# Patient Record
Sex: Female | Born: 1985 | Race: Black or African American | Hispanic: No | Marital: Single | Smoking: Never smoker
Health system: Southern US, Community
[De-identification: ages and names within clinical notes are randomized; demographics above are authoritative.]

## PROBLEM LIST (undated history)

## (undated) DIAGNOSIS — N736 Female pelvic peritoneal adhesions (postinfective): Secondary | ICD-10-CM

## (undated) DIAGNOSIS — D259 Leiomyoma of uterus, unspecified: Secondary | ICD-10-CM

## (undated) DIAGNOSIS — Z8759 Personal history of other complications of pregnancy, childbirth and the puerperium: Secondary | ICD-10-CM

## (undated) DIAGNOSIS — N920 Excessive and frequent menstruation with regular cycle: Secondary | ICD-10-CM

## (undated) DIAGNOSIS — D649 Anemia, unspecified: Secondary | ICD-10-CM

## (undated) DIAGNOSIS — Z9851 Tubal ligation status: Secondary | ICD-10-CM

## (undated) DIAGNOSIS — J45909 Unspecified asthma, uncomplicated: Secondary | ICD-10-CM

## (undated) HISTORY — DX: Female pelvic peritoneal adhesions (postinfective): N73.6

## (undated) HISTORY — DX: Excessive and frequent menstruation with regular cycle: N92.0

## (undated) HISTORY — DX: Tubal ligation status: Z98.51

## (undated) HISTORY — DX: Anemia, unspecified: D64.9

## (undated) HISTORY — DX: Personal history of other complications of pregnancy, childbirth and the puerperium: Z87.59

## (undated) HISTORY — DX: Leiomyoma of uterus, unspecified: D25.9

## (undated) HISTORY — PX: TUBAL LIGATION: SHX77

---

## 2004-11-08 ENCOUNTER — Emergency Department: Payer: Self-pay | Admitting: Emergency Medicine

## 2004-12-05 ENCOUNTER — Emergency Department: Payer: Self-pay | Admitting: Emergency Medicine

## 2005-05-02 ENCOUNTER — Observation Stay: Payer: Self-pay

## 2005-06-04 ENCOUNTER — Inpatient Hospital Stay: Payer: Self-pay | Admitting: Obstetrics & Gynecology

## 2008-03-31 ENCOUNTER — Emergency Department: Payer: Self-pay | Admitting: Emergency Medicine

## 2009-07-20 ENCOUNTER — Emergency Department: Payer: Self-pay | Admitting: Emergency Medicine

## 2011-08-07 ENCOUNTER — Emergency Department: Payer: Self-pay | Admitting: *Deleted

## 2011-09-25 ENCOUNTER — Observation Stay: Payer: Self-pay

## 2011-10-19 LAB — HM PAP SMEAR: HM PAP: NORMAL

## 2011-10-28 ENCOUNTER — Emergency Department: Payer: Self-pay | Admitting: Emergency Medicine

## 2011-11-08 ENCOUNTER — Observation Stay: Payer: Self-pay | Admitting: Obstetrics and Gynecology

## 2011-11-08 LAB — URINALYSIS, COMPLETE
Bacteria: NONE SEEN
Bilirubin,UR: NEGATIVE
Blood: NEGATIVE
Glucose,UR: NEGATIVE mg/dL (ref 0–75)
Leukocyte Esterase: NEGATIVE
Nitrite: NEGATIVE
Ph: 6 (ref 4.5–8.0)
RBC,UR: <1 /HPF (ref 0–5)
Specific Gravity: 1.012 (ref 1.003–1.030)
Squamous Epithelial: <1
WBC UR: 1 /HPF (ref 0–5)

## 2011-11-10 LAB — URINE CULTURE

## 2011-11-25 ENCOUNTER — Observation Stay: Payer: Self-pay | Admitting: Obstetrics and Gynecology

## 2011-12-06 ENCOUNTER — Inpatient Hospital Stay: Payer: Self-pay | Admitting: Obstetrics and Gynecology

## 2011-12-06 LAB — CBC WITH DIFFERENTIAL/PLATELET
Basophil %: 0.2 %
Eosinophil #: 0.3 10*3/uL (ref 0.0–0.7)
Eosinophil %: 4.3 %
HCT: 33.9 % — ABNORMAL LOW (ref 35.0–47.0)
Lymphocyte %: 19.7 %
MCH: 30.9 pg (ref 26.0–34.0)
MCHC: 34 g/dL (ref 32.0–36.0)
Monocyte #: 0.4 10*3/uL (ref 0.0–0.7)
Monocyte %: 5.5 %
Neutrophil #: 4.6 10*3/uL (ref 1.4–6.5)
Neutrophil %: 70.3 %
Platelet: 244 10*3/uL (ref 150–440)
RBC: 3.73 10*6/uL — ABNORMAL LOW (ref 3.80–5.20)
WBC: 6.6 10*3/uL (ref 3.6–11.0)

## 2011-12-08 LAB — HEMATOCRIT: HCT: 28.5 % — ABNORMAL LOW (ref 35.0–47.0)

## 2012-01-06 ENCOUNTER — Ambulatory Visit: Payer: Self-pay | Admitting: Obstetrics and Gynecology

## 2012-01-06 LAB — URINALYSIS, COMPLETE
Bacteria: NONE SEEN
Glucose,UR: NEGATIVE mg/dL (ref 0–75)
Leukocyte Esterase: NEGATIVE
Nitrite: NEGATIVE
RBC,UR: NONE SEEN /HPF (ref 0–5)
Specific Gravity: 1.013 (ref 1.003–1.030)
Squamous Epithelial: 4
WBC UR: <1 /HPF (ref 0–5)

## 2012-01-06 LAB — PREGNANCY, URINE: Pregnancy Test, Urine: NEGATIVE m[IU]/mL

## 2012-01-06 LAB — HEMOGLOBIN: HGB: 11.3 g/dL — ABNORMAL LOW (ref 12.0–16.0)

## 2012-01-16 ENCOUNTER — Ambulatory Visit: Payer: Self-pay | Admitting: Obstetrics and Gynecology

## 2012-01-16 LAB — PREGNANCY, URINE: Pregnancy Test, Urine: NEGATIVE m[IU]/mL

## 2013-01-30 ENCOUNTER — Emergency Department: Payer: Self-pay | Admitting: Emergency Medicine

## 2013-01-30 LAB — URINALYSIS, COMPLETE
RBC,UR: 2 /HPF (ref 0–5)
Squamous Epithelial: 7
WBC UR: 2 /HPF (ref 0–5)

## 2013-01-30 LAB — CBC
HCT: 31.1 % — ABNORMAL LOW (ref 35.0–47.0)
MCH: 27.4 pg (ref 26.0–34.0)
Platelet: 324 10*3/uL (ref 150–440)
RBC: 3.85 10*6/uL (ref 3.80–5.20)
RDW: 15.1 % — ABNORMAL HIGH (ref 11.5–14.5)
WBC: 6.2 10*3/uL (ref 3.6–11.0)

## 2013-01-30 LAB — COMPREHENSIVE METABOLIC PANEL
Albumin: 3.3 g/dL — ABNORMAL LOW (ref 3.4–5.0)
Alkaline Phosphatase: 44 U/L — ABNORMAL LOW (ref 50–136)
BUN: 14 mg/dL (ref 7–18)
Co2: 26 mmol/L (ref 21–32)
Creatinine: 0.86 mg/dL (ref 0.60–1.30)
EGFR (African American): >60
EGFR (Non-African Amer.): >60
Glucose: 84 mg/dL (ref 65–99)
SGOT(AST): 17 U/L (ref 15–37)
Sodium: 141 mmol/L (ref 136–145)

## 2014-12-16 HISTORY — PX: BILATERAL SALPINGECTOMY: SHX5743

## 2014-12-19 ENCOUNTER — Observation Stay: Payer: Self-pay | Admitting: Obstetrics and Gynecology

## 2014-12-19 DIAGNOSIS — Z8759 Personal history of other complications of pregnancy, childbirth and the puerperium: Secondary | ICD-10-CM

## 2014-12-19 HISTORY — DX: Personal history of other complications of pregnancy, childbirth and the puerperium: Z87.59

## 2015-01-21 ENCOUNTER — Emergency Department: Admit: 2015-01-21 | Disposition: A | Discharge status: Home / Self Care | Payer: Self-pay | Admitting: Student

## 2015-01-21 LAB — CBC
HCT: 34.9 % — ABNORMAL LOW (ref 35.0–47.0)
HGB: 11 g/dL — AB (ref 12.0–16.0)
MCH: 23.4 pg — ABNORMAL LOW (ref 26.0–34.0)
MCHC: 31.4 g/dL — AB (ref 32.0–36.0)
MCV: 74 fL — ABNORMAL LOW (ref 80–100)
PLATELETS: 281 10*3/uL (ref 150–440)
RBC: 4.7 10*6/uL (ref 3.80–5.20)
RDW: 29.1 % — AB (ref 11.5–14.5)
WBC: 4.1 10*3/uL (ref 3.6–11.0)

## 2015-01-21 LAB — HCG, QUANTITATIVE, PREGNANCY: Beta Hcg, Quant.: 1 m[IU]/mL

## 2015-02-08 NOTE — Op Note (Signed)
PATIENT NAME:  Anita [name], Anita [name] MR#:  [id number] DATE OF BIRTH:  March 24, 1986  DATE OF PROCEDURE:  01/16/2012  PREOPERATIVE DIAGNOSIS: Desires permanent sterilization, declines less permanent means.   POSTOPERATIVE DIAGNOSIS: Desires permanent sterilization, declines less permanent means.  PROCEDURE: Laparoscopic tubal cautery.   SURGEON: Ricky L. Amalia Hailey, MD  ANESTHESIA: General endotracheal.   FINDINGS: Grossly normal obese abdomen, uterus with what appears to be a left fundal fibroid, grossly normal tubes and ovaries.   ESTIMATED BLOOD LOSS: Minimal.   COMPLICATIONS: None.   DRAINS: In and out catheter, approximately 150 mL of clear urine.   PROCEDURE IN DETAIL: The patient is postpartum. She is obese. She desires sterilization. She consented to interval laparoscopic tubal. We discussed with the patient including failure of 2:300 and 60% chance of ectopic with subsequent pregnancies. She expressed her understand and desired to proceed and declined less permanent means. Consent was signed. Medicaid papers were signed in November.   She was taken to the operating room and placed in the supine position where anesthesia was initiated. She was then placed in the dorsal lithotomy position using Allen stirrups, prepped and draped in the usual sterile fashion. The cervix was visualized. Hulka tenaculum was placed. The bladder was drained.   10 mL of 0.5% Sensorcaine was infused infraumbilically and a #94 blade was used to make a vertical 1 cm infraumbilical incision through which a 10 mm Optiview port was placed under direct visualization with entry into the peritoneal cavity. Pneumoperitoneum was established.  The patient was placed in Trendelenburg position and laparoscopic tubal cautery was carried out in a relatively avascular region in the mesosalpinx bilaterally. Areas were hemostatic, both before and after lowing pressure to 6 mmHg, and pneumoperitoneum was allowed to resolve. The incisions  were closed with deep of zero and a Band-Aid was placed.   All instrument, needle, and sponge counts were correct. The patient tolerated the procedure well. I anticipate a routine postoperative course.  ____________________________ Rockey Situ. Amalia Hailey, MD rle:slb D: 01/16/2012 09:52:55 ET T: 01/16/2012 11:25:19 ET JOB#: [id number]  cc: Ricky L. Amalia Hailey, MD, <Dictator> Selmer Dominion MD ELECTRONICALLY SIGNED 01/17/2012 20:54

## 2015-02-09 LAB — SURGICAL PATHOLOGY

## 2015-02-15 NOTE — Op Note (Signed)
PATIENT NAME:  Preble, LUANNA WEESNER MR#:  [id number] DATE OF BIRTH:  06-29-1986  DATE OF PROCEDURE:  12/19/2014  PREOPERATIVE DIAGNOSES: 1.  Suspect tubal pregnancy.  2.  Right lower quadrant abdominal pain.  3.  Positive pregnancy test. 4.  Status post bilateral tubal ligation.   POSTOPERATIVE DIAGNOSES: 1.  Suspected tubal pregnancy. 2.  Right lower quadrant pain. 3.  Positive pregnancy test.  4.  Status post bilateral tubal ligation.  5.  Left tubal pregnancy.  6.  Pelvic adhesive disease.   OPERATIVE PROCEDURE: Laparoscopic bilateral salpingectomy with excision of ectopic and adhesiolysis.   SURGEON: Malachi Paradise, MD  FIRST ASSISTANT: None.   ANESTHESIA: General endotracheal.   INDICATIONS: The patient is a 29 year old African American female gravida 4, para 2-1-0-2, status post BTL for contraception, with last menstrual period 1 month ago, who presented to the Emergency Room with right lower quadrant pain, positive pregnancy test. emergency room work-up demonstrated an empty uterine cavity on ultrasound along with quantitative hCG of 2400. Fibroid uterus was also noted.   FINDINGS AT SURGERY: There was a 16 week size globular fibroid uterus which extended to the pelvic sidewalls. There was a small hemoperitoneum of approximately 15 mL. There was evidence of left tubal pregnancy with POC extruding from the distal fimbriated ends. This left adnexa was stuck to the left pelvic sidewall and ovary. The left ovary had 2 simple cysts. Right ovary was normal and distal right fallopian tube was normal.   DESCRIPTION OF PROCEDURE: The patient was brought to the operating room where she was placed in the supine position. General endotracheal anesthesia was induced without difficulty. She was placed in dorsal lithotomy position using the bumblebee stirrups. A ChloraPrep and Betadine abdominal, perineal, intravaginal prep and drape was performed in standard fashion. A red Robinson catheter  was used to drain 50 mL of urine from the bladder. Hulka tenaculum was placed onto the cervix to facilitate uterine manipulation. Subumbilical vertical incision 5 mm in length was made. The Optiview laparoscopic trocar system was used to place a 5 mm port directly into the abdominopelvic cavity without evidence of bowel or vascular injury. A second 5 mm port was placed in the left lower quadrant and an 11 mm port was placed in the right lower quadrant under direct visualization. The above-noted findings were photo documented. The left adnexal adhesions and tubal adhesions to the pelvic sidewall were taken down using the Ace Harmonic scalpel. This was followed by excising the left tube with the aid of graspers and the Harmonic scalpel. The mesosalpinx was clamped, coagulated and cut successively to remove the tube and ectopic specimen. A similar procedure was carried out on the right tube with the Harmonic scalpel and graspers. Specimens were removed through the 11 mm port sites. Irrigation of the pelvis was performed and inspection confirmed hemostasis. The irrigant fluid was aspirated. The procedure was then terminated with all instrumentation being removed from the abdominopelvic cavity. Pneumoperitoneum was released. The incisions were closed with 0 Vicryl on the fascia in the 11 mm port incision site. The skin incisions were closed with simple interrupted sutures of 4-0 plain. Band-Aids were placed. The patient was then awakened, extubated and taken to the recovery room in satisfactory condition.   ESTIMATED BLOOD LOSS: 15 mL.   IV FLUIDS: 800 mL.   URINE OUTPUT: 50 mL.   COUNTS: All instruments, needle, and sponge counts were verified as correct.   ADDENDUM NOTE: The patient likely has anemia secondary to menorrhagia  from her uterine fibroids. It is likely that the patient would need an open procedure for hysterectomy if indicated because of the 16 week size and globular nature of the organ which  extends to the pelvic sidewalls. Laparoscopic approach would be challenging due to these anatomic findings.  ____________________________ Alanda Slim Averianna Brugger, MD mad:sb D: 12/19/2014 04:30:45 ET T: 12/19/2014 10:02:23 ET JOB#: [id number]  cc: Hassell Done A. Rahel Carlton, MD, <Dictator> Alanda Slim Karah Caruthers MD ELECTRONICALLY SIGNED 12/22/2014 15:54

## 2015-02-15 NOTE — H&P (Signed)
PATIENT NAME:  [name], [name] MR#:  [id number] DATE OF BIRTH:  03/20/1986  DATE OF ADMISSION:  12/18/2014  CHIEF COMPLAINT:  1.  Suspected tubal pregnancy.  2.  Right lower quadrant.  3.  Positive pregnancy test.  4.  Status post bilateral tubal ligation.    HISTORY: The patient is a 29 year old African American female gravida 4, para 2-1-0-2, status post BTL for contraception with last menstrual period 1 month ago, who presents to the Emergency Room because of right lower quadrant pain and a positive home pregnancy test. The patient felt not quite right and was concerned about pregnancy. She did not have any vaginal bleeding. Evaluation in the Emergency Room demonstrated an empty uterus on ultrasound and a quantitative hCG of 2400. No significant mass or free peritoneal fluid were seen. The patient is anemic with hemoglobin of 7.6 and hematocrit of 25.4. In light of these findings, the patient was counseled to undergo surgery for removal of ectopic and excision of both fallopian tubes.   PAST MEDICAL HISTORY:  1.  Anemia.  2.  Migraines.  3.  Asthma.  4.  Increased body mass index.  PAST SURGICAL HISTORY: BTL.   PAST OBSTETRIC HISTORY: Para 2-1-0-2, G1, 24-week twins with preterm delivery, both deceased; G2, SVD; G3, SVD; G4, current.   FAMILY HISTORY: Negative for cancer of the colon, ovary, or breast. Her mother is deceased from liver cancer.   SOCIAL HISTORY: The patient does not smoke, does not drink, does not use drugs.   CURRENT MEDICATIONS: None.   DRUG ALLERGIES: MICRONIZED PROGESTERONE.   PHYSICAL EXAMINATION:  VITAL SIGNS: Stable. Heart rate 75 beats per minute.  GENERAL: The patient is a pleasant, alert African American female in no acute distress.  LUNGS: Clear.  HEART: Regular rate and rhythm without murmur.  ABDOMEN: Soft, mild tenderness in right lower quadrant without significant peritoneal signs.  PELVIC: Deferred.  EXTREMITIES: Warm and dry.   IMPRESSION:   1.  Right lower quadrant pain with positive hCG, status post tubal ligation, suspicious for ectopic pregnancy.  2.  Anemia.   PLAN: Laparoscopic excision of ectopic pregnancy and bilateral salpingectomy.   PREOPERATIVE CONSENT: The patient is understanding of the planned procedure and she is aware of and is accepting of all surgical risks which include, but are not limited to bleeding, infection, pelvic organ injury with need for repair, blood clot disorders, anesthesia risks, etc. The patient does understand that she will no longer be able to conceive with removal of both fallopian tubes. All questions have been answered. Informed consent is given. The patient is ready and willing to proceed with surgery as scheduled.     ____________________________ Alanda Slim DeFrancesco, MD mad:bm D: 12/19/2014 02:40:00 ET T: 12/19/2014 03:10:03 ET JOB#: [id number]  cc: Hassell Done A. DeFrancesco, MD, <Dictator> Alanda Slim DEFRANCESCO MD ELECTRONICALLY SIGNED 12/22/2014 15:54

## 2015-02-24 NOTE — H&P (Signed)
L&D Evaluation:  History:   HPI 29 y/o G3 P0201 @ 39wks EDC 12/13/11 observed on LD for the last hour due to regular contractions beginning this am. Care @ [facility] HX preterm delivery of twins at 72 weeks-(both babies died day after birth 57) 55 week subsequent well delivery, obesity. Denies leaking fluid or vaginal bleeding, baby is active. GBS +    Presents with contractions    Patient's Medical History No Chronic Illness    Patient's Surgical History none    Medications Pre [place] Vitamins    Allergies 17 hydroprogesterone    Social History none    Family History Non-Contributory   ROS:   ROS All systems were reviewed.  HEENT, CNS, GI, GU, Respiratory, CV, Renal and Musculoskeletal systems were found to be normal.   Exam:   Vital Signs stable    Urine Protein not completed    General no apparent distress    Mental Status clear    Chest clear    Heart normal sinus rhythm    Abdomen gravid, non-tender    Estimated Fetal Weight Large for gestational age    Fetal Position vtx    Fundal Height term+    Back no CVAT    Edema 1+    Reflexes 2+    Clonus negative    Pelvic no external lesions, (closed @ recent [facility] visit) 3-4cm 50% vtx well applied @ -1 BOWI sm show    Mebranes Intact    FHT 130's avg variability with accels    Ucx irregular, q 3/5 mins 60 sec moderate    Skin dry    Lymph no lymphadenopathy   Impression:   Impression early labor   Plan:   Plan monitor contractions and for cervical change    Comments Admitted explained plan of care, knows what to expect. Plans IV pain meds, may decline epidural. Will begin pitocin augment. Family supportive at bedside. Plans PPBTL medicaid papers signed.   Electronic Signatures: Rosie Fate (CNM)  (Signed 19-Feb-13 13:52)  Authored: L&D Evaluation   Last Updated: 19-Feb-13 13:52 by Rosie Fate (CNM)

## 2015-02-24 NOTE — H&P (Signed)
L&D Evaluation:  History:   HPI 29 y/o BF  G3P0201 [facility] 12/13/11 Presents C/O pelvic pressure and pain    Presents with abdominal pain    Patient's Medical History No Chronic Illness  PTL&D at 24 weeks (both died); 36 week SVD OK    Patient's Surgical History none    Medications Pre [place] Vitamins    Allergies NKDA    Social History none    Family History Non-Contributory   ROS:   ROS All systems were reviewed.  HEENT, CNS, GI, GU, Respiratory, CV, Renal and Musculoskeletal systems were found to be normal.   Exam:   Vital Signs stable    General no apparent distress    Mental Status clear    Heart normal sinus rhythm    Abdomen gravid, non-tender    Estimated Fetal Weight Average for gestational age    Back no CVAT    Edema 1+    Mebranes Intact    FHT normal rate with no decels    Ucx irregular    Other pt noted pain occurs with fetal mvt only UC's resoved with PO hydration   Impression:   Impression dehydration, abd pain   Plan:   Comments Hydrate Comfort measures   Electronic Signatures: Edison Nasuti (MD)  (Signed 22-Jan-13 14:01)  Authored: L&D Evaluation   Last Updated: 22-Jan-13 14:01 by Edison Nasuti (MD)

## 2015-03-26 ENCOUNTER — Ambulatory Visit: Payer: Medicaid Other | Admitting: Obstetrics and Gynecology

## 2015-04-29 ENCOUNTER — Ambulatory Visit: Payer: Medicaid Other | Admitting: Obstetrics and Gynecology

## 2015-05-05 ENCOUNTER — Ambulatory Visit (INDEPENDENT_AMBULATORY_CARE_PROVIDER_SITE_OTHER): Payer: Medicaid Other | Admitting: Obstetrics and Gynecology

## 2015-05-05 ENCOUNTER — Encounter: Payer: Self-pay | Admitting: Obstetrics and Gynecology

## 2015-05-05 VITALS — BP 108/74 | HR 71 | Ht 68.0 in | Wt 276.5 lb

## 2015-05-05 DIAGNOSIS — D259 Leiomyoma of uterus, unspecified: Secondary | ICD-10-CM | POA: Diagnosis not present

## 2015-05-05 DIAGNOSIS — Z8759 Personal history of other complications of pregnancy, childbirth and the puerperium: Secondary | ICD-10-CM | POA: Insufficient documentation

## 2015-05-05 DIAGNOSIS — N736 Female pelvic peritoneal adhesions (postinfective): Secondary | ICD-10-CM | POA: Diagnosis not present

## 2015-05-05 DIAGNOSIS — D649 Anemia, unspecified: Secondary | ICD-10-CM | POA: Insufficient documentation

## 2015-05-05 DIAGNOSIS — N920 Excessive and frequent menstruation with regular cycle: Secondary | ICD-10-CM | POA: Insufficient documentation

## 2015-05-05 MED ORDER — LEVONORGESTREL-ETHINYL ESTRAD 0.1-20 MG-MCG PO TABS: 1.0000 | ORAL_TABLET | Freq: Every day | ORAL | Status: DC

## 2015-05-05 NOTE — Progress Notes (Signed)
Patient ID: Anita [name], female   DOB: 08-03-1986, 29 y.o.   MRN: [id number]  3 month f/u on aub  GYN ENCOUNTER NOTE  Subjective:       Anita [name] is a 29 y.o. [phone] female is here for gynecologic evaluation of the following issues:  1. Menorrhagia    D/c ocp- after 1 month did not help Cycles once a month- 5 days, 1-3 heavy- changing every 2 hours- wearing multiple pads  Gynecologic History Patient's last menstrual period was 04/21/2015 (exact date).  Obstetric History OB History  Gravida Para Term Preterm AB SAB TAB Ectopic Multiple Living  4 3 2 1 1   1 1 2     # Outcome Date GA Lbr Len/2nd Weight Sex Delivery Anes PTL Lv  4 Ectopic 2016          3 Term 2013   5 lb 1.9 oz (2.322 kg) F Vag-Spont   Y  2 Term 2006   5 lb 1.8 oz (2.318 kg) F Vag-Spont   Y  1A Preterm 2005    M Vag-Spont   FD  1B Preterm 2005    M    FD      Past Medical History  Diagnosis Date  . Anemia   . Fibroid uterus   . S/P tubal ligation   . Heavy periods   . Pelvic peritoneal adhesions, female   . S/P ectopic pregnancy 12/19/2014    Left tubal pregnancy, excised.    History reviewed. No pertinent past surgical history.  Current Outpatient Prescriptions on File Prior to Visit  Medication Sig Dispense Refill  . docusate sodium (COLACE) 100 MG capsule Take 100 mg by mouth 2 (two) times daily.    . ferrous sulfate 325 (65 FE) MG tablet Take 325 mg by mouth daily.    Marland Kitchen ibuprofen (ADVIL,MOTRIN) 800 MG tablet Take 800 mg by mouth every 8 (eight) hours as needed.     No current facility-administered medications on file prior to visit.    Allergies  Allergen Reactions  . Progestins Hives    History   Social History  . Marital Status: Single    Spouse Name: N/A  . Number of Children: N/A  . Years of Education: N/A   Occupational History  . Not on file.   Social History Main Topics  . Smoking status: Never Smoker   . Smokeless tobacco: Not on file  . Alcohol Use: No  .  Drug Use: No  . Sexual Activity: Yes    Birth Control/ Protection: None   Other Topics Concern  . Not on file   Social History Narrative    Family History  Problem Relation Age of Onset  . Liver cancer Mother   . Diabetes Paternal Grandmother   . Heart disease Neg Hx     The following portions of the patient's history were reviewed and updated as appropriate: allergies, current medications, past family history, past medical history, past social history, past surgical history and problem list.  Review of Systems Review of Systems - General ROS: negative for - chills, fatigue, fever, hot flashes, malaise or night sweats Hematological and Lymphatic ROS: negative for - bleeding problems or swollen lymph nodes Gastrointestinal ROS: negative for - abdominal pain, blood in stools, change in bowel habits and nausea/vomiting Musculoskeletal ROS: negative for - joint pain, muscle pain or muscular weakness Genito-Urinary ROS: negative for -dysmenorrhea, dyspareunia, dysuria, genital discharge, genital ulcers, hematuria, incontinence, irregular/heavy menses, nocturia or pelvic  pain.POSITIVE-change in menstrual cycle  Objective:   BP 108/74 mmHg  Pulse 71  Ht 5\' 8"  (1.727 m)  Wt 276 lb 8 oz (125.42 kg)  BMI 42.05 kg/m2  LMP 04/21/2015 (Exact Date) CONSTITUTIONAL: Well-developed, well-nourished female in no acute distress.  HENT:  Normocephalic, atraumatic.  SKIN: Skin is warm and dry. No rash noted. Not diaphoretic. No erythema. No pallor. [place]: Alert and oriented to person, place, and time. PSYCHIATRIC: Normal mood and affect. Normal behavior. Normal judgment and thought content. CARDIOVASCULAR:Not Examined RESPIRATORY: Not Examined BREASTS: Not Examined ABDOMEN: Soft, non distended; Non tender.  No Organomegaly. PELVIC:  External Genitalia: Normal  BUS: Normal  Vagina: Normal  Cervix: Normal  Uterus: Normal size, shape,consistency, mobile  Adnexa: Normal  RV: Normal    Bladder: Nontende    Assessment:   1. Menorrhagia with regular cycle  - CBC with Differential/Platelet     Plan:   1.Restart OCP'S 2. Menstrual Calendar Monitoring 3.Return 3 months.

## 2015-05-07 LAB — CBC WITH DIFFERENTIAL/PLATELET
BASOS: 1 %
Basophils Absolute: 0 10*3/uL (ref 0.0–0.2)
EOS (ABSOLUTE): 0.2 10*3/uL (ref 0.0–0.4)
Eos: 4 %
HEMATOCRIT: 29.6 % — AB (ref 34.0–46.6)
HEMOGLOBIN: 9.3 g/dL — AB (ref 11.1–15.9)
IMMATURE GRANULOCYTES: 0 %
Immature Grans (Abs): 0 10*3/uL (ref 0.0–0.1)
LYMPHS ABS: 2 10*3/uL (ref 0.7–3.1)
Lymphs: 35 %
MCH: 23.3 pg — ABNORMAL LOW (ref 26.6–33.0)
MCHC: 31.4 g/dL — ABNORMAL LOW (ref 31.5–35.7)
MCV: 74 fL — ABNORMAL LOW (ref 79–97)
Monocytes Absolute: 0.4 10*3/uL (ref 0.1–0.9)
Monocytes: 7 %
NEUTROS PCT: 53 %
Neutrophils Absolute: 3.2 10*3/uL (ref 1.4–7.0)
PLATELETS: 383 10*3/uL — AB (ref 150–379)
RBC: 3.99 x10E6/uL (ref 3.77–5.28)
RDW: 16.1 % — AB (ref 12.3–15.4)
WBC: 5.9 10*3/uL (ref 3.4–10.8)

## 2015-07-13 ENCOUNTER — Telehealth: Payer: Self-pay | Admitting: Obstetrics and Gynecology

## 2015-07-13 NOTE — Telephone Encounter (Signed)
Pt called and she has fibroids and was given BC, and her periods are really bad with the bleeding, she wanted to know what to do about this, if there is anything she can do.

## 2015-07-13 NOTE — Telephone Encounter (Signed)
Pt states her cycle is still very heavy even on the ocp. Cycle comes q 30d, last about 6d, 4 days are heavy. Changing q 2 hours. Pt is wearing mulitple pads. She is taking pills same time q day. Offered to move appt up but pt declines at this time. Wants to discuss hyst but needs to get her insurance and time off correct first.

## 2015-08-05 ENCOUNTER — Ambulatory Visit: Payer: Medicaid Other | Admitting: Obstetrics and Gynecology

## 2016-01-27 DIAGNOSIS — Z9851 Tubal ligation status: Secondary | ICD-10-CM | POA: Diagnosis not present

## 2016-01-27 DIAGNOSIS — R079 Chest pain, unspecified: Secondary | ICD-10-CM | POA: Diagnosis not present

## 2016-01-27 DIAGNOSIS — N938 Other specified abnormal uterine and vaginal bleeding: Secondary | ICD-10-CM | POA: Diagnosis not present

## 2016-01-27 DIAGNOSIS — D649 Anemia, unspecified: Principal | ICD-10-CM | POA: Insufficient documentation

## 2016-01-27 DIAGNOSIS — Z800 Family history of malignant neoplasm of digestive organs: Secondary | ICD-10-CM | POA: Diagnosis not present

## 2016-01-27 DIAGNOSIS — N920 Excessive and frequent menstruation with regular cycle: Secondary | ICD-10-CM | POA: Insufficient documentation

## 2016-01-27 DIAGNOSIS — N736 Female pelvic peritoneal adhesions (postinfective): Secondary | ICD-10-CM | POA: Diagnosis not present

## 2016-01-27 DIAGNOSIS — R51 Headache: Secondary | ICD-10-CM | POA: Diagnosis not present

## 2016-01-27 DIAGNOSIS — Z9119 Patient's noncompliance with other medical treatment and regimen: Secondary | ICD-10-CM | POA: Diagnosis not present

## 2016-01-27 DIAGNOSIS — R0602 Shortness of breath: Secondary | ICD-10-CM | POA: Diagnosis not present

## 2016-01-27 DIAGNOSIS — Z833 Family history of diabetes mellitus: Secondary | ICD-10-CM | POA: Diagnosis not present

## 2016-01-27 DIAGNOSIS — D259 Leiomyoma of uterus, unspecified: Secondary | ICD-10-CM | POA: Insufficient documentation

## 2016-01-28 ENCOUNTER — Encounter: Payer: Self-pay | Admitting: Emergency Medicine

## 2016-01-28 ENCOUNTER — Observation Stay: Payer: Medicaid Other

## 2016-01-28 ENCOUNTER — Encounter: Payer: Self-pay | Admitting: Obstetrics and Gynecology

## 2016-01-28 ENCOUNTER — Observation Stay
Admission: EM | Admit: 2016-01-28 | Discharge: 2016-01-28 | Disposition: A | Discharge status: Home / Self Care | Payer: Medicaid Other | Attending: Obstetrics and Gynecology | Admitting: Obstetrics and Gynecology

## 2016-01-28 ENCOUNTER — Emergency Department: Payer: Medicaid Other

## 2016-01-28 DIAGNOSIS — D649 Anemia, unspecified: Secondary | ICD-10-CM | POA: Diagnosis not present

## 2016-01-28 DIAGNOSIS — D259 Leiomyoma of uterus, unspecified: Secondary | ICD-10-CM | POA: Diagnosis present

## 2016-01-28 DIAGNOSIS — N920 Excessive and frequent menstruation with regular cycle: Secondary | ICD-10-CM

## 2016-01-28 LAB — URINALYSIS COMPLETE WITH MICROSCOPIC (ARMC ONLY)
Bilirubin Urine: NEGATIVE
Glucose, UA: NEGATIVE mg/dL
Hgb urine dipstick: NEGATIVE
KETONES UR: NEGATIVE mg/dL
Leukocytes, UA: NEGATIVE
Nitrite: NEGATIVE
PH: 6 (ref 5.0–8.0)
Protein, ur: NEGATIVE mg/dL
RBC / HPF: NONE SEEN RBC/hpf (ref 0–5)
Specific Gravity, Urine: 1.011 (ref 1.005–1.030)

## 2016-01-28 LAB — POCT PREGNANCY, URINE: PREG TEST UR: NEGATIVE

## 2016-01-28 LAB — CBC
HEMATOCRIT: 19.4 % — AB (ref 35.0–47.0)
HEMOGLOBIN: 5.6 g/dL — AB (ref 12.0–16.0)
MCH: 15.3 pg — AB (ref 26.0–34.0)
MCHC: 29.1 g/dL — ABNORMAL LOW (ref 32.0–36.0)
MCV: 52.6 fL — ABNORMAL LOW (ref 80.0–100.0)
Platelets: 335 10*3/uL (ref 150–440)
RBC: 3.68 MIL/uL — ABNORMAL LOW (ref 3.80–5.20)
RDW: 20.6 % — AB (ref 11.5–14.5)
WBC: 8.2 10*3/uL (ref 3.6–11.0)

## 2016-01-28 LAB — BASIC METABOLIC PANEL
ANION GAP: 6 (ref 5–15)
BUN: 12 mg/dL (ref 6–20)
CHLORIDE: 106 mmol/L (ref 101–111)
CO2: 22 mmol/L (ref 22–32)
Calcium: 8.5 mg/dL — ABNORMAL LOW (ref 8.9–10.3)
Creatinine, Ser: 0.86 mg/dL (ref 0.44–1.00)
GFR calc Af Amer: >60 mL/min (ref >60)
GFR calc non Af Amer: >60 mL/min (ref >60)
GLUCOSE: 80 mg/dL (ref 65–99)
Potassium: 3.4 mmol/L — ABNORMAL LOW (ref 3.5–5.1)
Sodium: 134 mmol/L — ABNORMAL LOW (ref 135–145)

## 2016-01-28 LAB — TROPONIN I: Troponin I: <0.03 ng/mL (ref <0.031)

## 2016-01-28 LAB — HEMOGLOBIN AND HEMATOCRIT, BLOOD
HCT: 22 % — ABNORMAL LOW (ref 35.0–47.0)
HEMATOCRIT: 26.3 % — AB (ref 35.0–47.0)
HEMOGLOBIN: 6.6 g/dL — AB (ref 12.0–16.0)
Hemoglobin: 8.1 g/dL — ABNORMAL LOW (ref 12.0–16.0)

## 2016-01-28 LAB — PREPARE RBC (CROSSMATCH)

## 2016-01-28 LAB — ABO/RH: ABO/RH(D): A POS

## 2016-01-28 MED ORDER — SODIUM CHLORIDE 0.9 % IV SOLN
Freq: Once | INTRAVENOUS | Status: AC
  Administered 2016-01-28: 04:00:00 via INTRAVENOUS

## 2016-01-28 MED ORDER — TEMAZEPAM 15 MG PO CAPS: 15.0000 mg | ORAL_CAPSULE | Freq: Every evening | ORAL | Status: DC | PRN

## 2016-01-28 MED ORDER — FERROUS SULFATE 325 (65 FE) MG PO TABS: 325.0000 mg | ORAL_TABLET | Freq: Three times a day (TID) | ORAL | Status: DC

## 2016-01-28 MED ORDER — DIPHENHYDRAMINE HCL 25 MG PO CAPS
25.0000 mg | ORAL_CAPSULE | Freq: Once | ORAL | Status: AC
  Administered 2016-01-28: 25 mg via ORAL
  Filled 2016-01-28: qty 1

## 2016-01-28 MED ORDER — ONDANSETRON HCL 4 MG PO TABS: 4.0000 mg | ORAL_TABLET | Freq: Four times a day (QID) | ORAL | Status: DC | PRN

## 2016-01-28 MED ORDER — DOCUSATE SODIUM 100 MG PO CAPS: 100.0000 mg | ORAL_CAPSULE | Freq: Two times a day (BID) | ORAL | Status: DC | PRN

## 2016-01-28 MED ORDER — MAGNESIUM HYDROXIDE 400 MG/5ML PO SUSP: 30.0000 mL | Freq: Every day | ORAL | Status: DC | PRN

## 2016-01-28 MED ORDER — ONDANSETRON HCL 4 MG/2ML IJ SOLN: 4.0000 mg | Freq: Four times a day (QID) | INTRAMUSCULAR | Status: DC | PRN

## 2016-01-28 MED ORDER — DOCUSATE SODIUM 100 MG PO CAPS
100.0000 mg | ORAL_CAPSULE | Freq: Two times a day (BID) | ORAL | Status: DC
  Administered 2016-01-28 (×2): 100 mg via ORAL
  Filled 2016-01-28 (×2): qty 1

## 2016-01-28 MED ORDER — SODIUM CHLORIDE 0.9 % IV SOLN
Freq: Once | INTRAVENOUS | Status: AC
  Administered 2016-01-28: 08:00:00 via INTRAVENOUS

## 2016-01-28 MED ORDER — ACETAMINOPHEN 325 MG PO TABS
650.0000 mg | ORAL_TABLET | Freq: Once | ORAL | Status: AC
  Administered 2016-01-28: 650 mg via ORAL
  Filled 2016-01-28: qty 2

## 2016-01-28 MED ORDER — IBUPROFEN 600 MG PO TABS
600.0000 mg | ORAL_TABLET | Freq: Four times a day (QID) | ORAL | Status: DC | PRN
  Administered 2016-01-28: 600 mg via ORAL
  Filled 2016-01-28 (×2): qty 1

## 2016-01-28 MED ORDER — ALUM & MAG HYDROXIDE-SIMETH 200-200-20 MG/5ML PO SUSP: 30.0000 mL | ORAL | Status: DC | PRN

## 2016-01-28 MED ORDER — MEDROXYPROGESTERONE ACETATE 150 MG/ML IM SUSP
150.0000 mg | Freq: Once | INTRAMUSCULAR | Status: AC
  Administered 2016-01-28: 150 mg via INTRAMUSCULAR
  Filled 2016-01-28: qty 1

## 2016-01-28 MED ORDER — LACTATED RINGERS IV SOLN
INTRAVENOUS | Status: DC
  Administered 2016-01-28: 10:00:00 via INTRAVENOUS

## 2016-01-28 MED ORDER — SODIUM CHLORIDE 0.9 % IV SOLN
10.0000 mL/h | Freq: Once | INTRAVENOUS | Status: AC
  Administered 2016-01-28: 10 mL/h via INTRAVENOUS

## 2016-01-28 NOTE — Progress Notes (Signed)
GYNECOLOGY INPATIENT PROGRESS NOTE  Subjective: Patient reports feeling better after blood transfusion.  Denies SOB, chest pain.     Objective: I have reviewed patient's vital signs, intake and output, medications and labs.  Blood pressure 122/63, pulse 87, temperature 98.7 F (37.1 C), temperature source Oral, resp. rate 18, height 5\' 9"  (1.753 m), weight 230 lb (104.327 kg), last menstrual period 01/09/2016, SpO2 99 %.  General: alert and no distress Resp: clear to auscultation bilaterally Cardio: regular rate and rhythm, S1, S2 normal, no murmur, click, rub or gallop GI: soft, non-tender; bowel sounds normal; no masses,  no organomegaly Extremities: extremities normal, atraumatic, no cyanosis or edema Vaginal Bleeding: none   Labs:  CBC Latest Ref Rng 01/28/2016 01/28/2016 01/28/2016  WBC 3.6 - 11.0 K/uL - - 8.2  Hemoglobin 12.0 - 16.0 g/dL 8.1(L) 6.6(L) 5.6(L)  Hematocrit 35.0 - 47.0 % 26.3(L) 22.0(L) 19.4(L)  Platelets 150 - 440 K/uL - - 335     Assessment/Plan: 1) Symptomatic anemia - now s/p total of 4 units PRBCs (inappropriate rise after initial 2 units). Patient no longer symptomatic.  Recommend continuation of iron therapy outpatient TID.  2) Menorrhagia with fibroid uterus - ordered pelvic ultrasound, but still not completed.  Can perform in office as outpatient.  Currently without episode of bleeding.  Desires to use Depo Provera for management.  Discussion had with patient's noted allergy of progestins (notes that it was the 17-OHP injections given during pregnancy, was compounded with a "sesame oil" which caused her to have with itching and flushing").  Patient notes that she is ok to try the Depo Provera.   Will d/c home today.  Advised patient to f/u in office in 3-4 weeks.Will complete ultrasound at that time. Will provide work notice.   Rubie Maid 01/28/2016, 8:01 PM

## 2016-01-28 NOTE — Progress Notes (Signed)
rn received call from dr cherry. md instructed rn to hold off further transfusions and check h/h after unit completed and call to md results

## 2016-01-28 NOTE — Progress Notes (Signed)
Pt discharged home. Pt stated she understood discharge instructions.

## 2016-01-28 NOTE — Progress Notes (Signed)
Results of h/h called to dr cherry.hemoglobin 8.1. Dr Marcelline Mates to come see pt to eval for discharge. md informed that ultrasound has not been done

## 2016-01-28 NOTE — ED Notes (Signed)
Pt reports she has had malaise, SOB on exertion, chest pain and frequent HA for "months" and today the symptoms worsened. Pt reports she was told she needed to take Iron, was prescribed, but has stopped taking them. On assessment, pt is pale skin toned, SOB when walking.

## 2016-01-28 NOTE — H&P (Signed)
GYNECOLOGY HISTORY AND PHYSICAL  Reason for Consult: Symptomatic anemia Referring Physician: Harvest Dark (ER Physician)   HPI:  Anita [name] is a 30 y.o. [phone] female with last menstrual period of 01/09/2016, who presented to the Emergency Room with complaints of headaches and shortness of breath.  The headaches have been ongoing for several weeks, are intermittent.  The shortness of breath has been ongning for several days, and is noted to be worse with exertion.  She has also been noting occasional chest pain with exertion that began yesterday.  Of note, patient has a h/o fibroid uterus and anemia secondary to heavy periods.  Is supposed to be taking iron supplements but is non-compliant.   While in the Emergency Room, patient's Hgb was noted to be 5.6.    Pertinent Gynecological History: Menarche age:51  Menses: flow is excessive with use of 8-9 pads or tampons on heaviest days.  Cycles last 5-6 days.  Bleeding: dysfunctional uterine bleeding Contraception: tubal ligation Blood transfusions: none Sexually transmitted diseases: no past history Previous GYN Procedures: bilateral salpingectomy s/p ectopic pregnancy  Last pap: normal Date: 10/2011    OB History  Gravida Para Term Preterm AB SAB TAB Ectopic Multiple Living  _0 # Outcome Date GA Lbr Len/2nd Weight Sex Delivery Anes PTL Lv  4 Ectopic 2016          3 Term 2013   5 lb 1.9 oz (2.322 kg) F Vag-Spont   Y  2 Term 2006   5 lb 1.8 oz (2.318 kg) F Vag-Spont   Y  1A Preterm 2005    M Vag-Spont   FD  1B Preterm 2005    M    FD       Past Medical History  Diagnosis Date  . Anemia   . Fibroid uterus   . Heavy periods   . Pelvic peritoneal adhesions, female   . S/P ectopic pregnancy 12/19/2014    Left tubal pregnancy, excised.    Past Surgical History  Procedure Laterality Date  . Tubal ligation    . Bilateral salpingectomy  12/2014    s/p left ectopic pregnancy    Family History  Problem  Relation Age of Onset  . Liver cancer Mother   . Diabetes Paternal Grandmother   . Heart disease Neg Hx     Social History   Social History  . Marital Status: Single    Spouse Name: N/A  . Number of Children: N/A  . Years of Education: N/A   Occupational History  . Not on file.   Social History Main Topics  . Smoking status: Never Smoker   . Smokeless tobacco: Not on file  . Alcohol Use: No  . Drug Use: No  . Sexual Activity: Yes    Birth Control/ Protection: None   Other Topics Concern  . Not on file   Social History Narrative    Allergies:  Allergies  Allergen Reactions  . Progestins Hives    No current facility-administered medications on file prior to encounter.   No current outpatient prescriptions on file prior to encounter.     Review of Systems  Constitutional: Negative for fever, chills and weight loss.  HENT: Negative for congestion, ear pain and sore throat.   Eyes: Negative for blurred vision and pain.  Respiratory: Positive for shortness of breath. Negative for cough and wheezing.   Cardiovascular: Positive for chest pain. Negative for  palpitations and leg swelling.  Gastrointestinal: Negative for nausea, vomiting, abdominal pain, diarrhea, constipation and blood in stool.  Genitourinary: Negative for dysuria, urgency and frequency.  Musculoskeletal: Negative for myalgias, back pain and falls.  Neurological: Positive for headaches. Negative for dizziness, sensory change and loss of consciousness.  Endo/Heme/Allergies: Does not bruise/bleed easily.  Psychiatric/Behavioral: Negative for depression and substance abuse. The patient does not have insomnia.     Blood pressure 129/83, pulse 73, temperature 98.7 F (37.1 C), temperature source Axillary, resp. rate 26, height _0  (1.753 m), weight 230 lb (104.327 kg), last menstrual period 01/09/2016, SpO2 100 %. Physical Exam  Constitutional: She is oriented to person, place, and time. She appears  well-developed and well-nourished. No distress.  HENT:  Head: Normocephalic and atraumatic.  Right Ear: External ear normal.  Left Ear: External ear normal.  Nose: Nose normal.  Mouth/Throat: Oropharynx is clear and moist.  Eyes: Conjunctivae and EOM are normal. Pupils are equal, round, and reactive to light.  Neck: Normal range of motion. Neck supple. No JVD present. No tracheal deviation present. No thyromegaly present.  Cardiovascular: Normal rate, regular rhythm and normal heart sounds.  Exam reveals no gallop and no friction rub.   No murmur heard. Respiratory: Effort normal and breath sounds normal. No respiratory distress.  GI: Soft. Bowel sounds are normal. She exhibits no distension. There is no tenderness.  Genitourinary: Vagina normal. No vaginal discharge found.  Cervix appears normal, no lesions. Uterus enlarged, ~ 16 week size, mobile, nontender.  Adnexae without tenderness bilaterally.   Musculoskeletal: Normal range of motion. She exhibits no edema or tenderness.  Lymphadenopathy:    She has no cervical adenopathy.  Neurological: She is alert and oriented to person, place, and time.  Skin: Skin is warm and dry. No rash noted. No erythema.  Psychiatric: She has a normal mood and affect. Her behavior is normal.    Labs:  Results for orders placed or performed during the hospital encounter of 01/28/16 (from the past 48 hour(s))  Basic metabolic panel     Status: Abnormal   Collection Time: 01/28/16 12:14 AM  Result Value Ref Range   Sodium 134 (L) 135 - 145 mmol/L   Potassium 3.4 (L) 3.5 - 5.1 mmol/L   Chloride 106 101 - 111 mmol/L   CO2 22 22 - 32 mmol/L   Glucose, Bld 80 65 - 99 mg/dL   BUN 12 6 - 20 mg/dL   Creatinine, Ser 0.86 0.44 - 1.00 mg/dL   Calcium 8.5 (L) 8.9 - 10.3 mg/dL   GFR calc non Af Amer >60 >60 mL/min   GFR calc Af Amer >60 >60 mL/min    Comment: (NOTE) The eGFR has been calculated using the CKD EPI equation. This calculation has not been  validated in all clinical situations. eGFR's persistently <60 mL/min signify possible Chronic Kidney Disease.    Anion gap 6 5 - 15  CBC     Status: Abnormal   Collection Time: 01/28/16 12:14 AM  Result Value Ref Range   WBC 8.2 3.6 - 11.0 K/uL   RBC 3.68 (L) 3.80 - 5.20 MIL/uL   Hemoglobin 5.6 (L) 12.0 - 16.0 g/dL   HCT 19.4 (L) 35.0 - 47.0 %   MCV 52.6 (L) 80.0 - 100.0 fL   MCH 15.3 (L) 26.0 - 34.0 pg   MCHC 29.1 (L) 32.0 - 36.0 g/dL   RDW 20.6 (H) 11.5 - 14.5 %   Platelets 335 150 - 440 K/uL  Troponin I     Status: None   Collection Time: 01/28/16 12:14 AM  Result Value Ref Range   Troponin I <0.03 <0.031 ng/mL    Comment:        NO INDICATION OF MYOCARDIAL INJURY.   Urinalysis complete, with microscopic ([facility] only)     Status: Abnormal   Collection Time: 01/28/16 12:15 AM  Result Value Ref Range   Color, Urine STRAW (A) YELLOW   APPearance CLEAR (A) CLEAR   Glucose, UA NEGATIVE NEGATIVE mg/dL   Bilirubin Urine NEGATIVE NEGATIVE   Ketones, ur NEGATIVE NEGATIVE mg/dL   Specific Gravity, Urine 1.011 1.005 - 1.030   Hgb urine dipstick NEGATIVE NEGATIVE   pH 6.0 5.0 - 8.0   Protein, ur NEGATIVE NEGATIVE mg/dL   Nitrite NEGATIVE NEGATIVE   Leukocytes, UA NEGATIVE NEGATIVE   RBC / HPF NONE SEEN 0 - 5 RBC/hpf   WBC, UA 0-5 0 - 5 WBC/hpf   Bacteria, UA RARE (A) NONE SEEN   Squamous Epithelial / LPF 0-5 (A) NONE SEEN   Mucous PRESENT   Pregnancy, urine POC     Status: None   Collection Time: 01/28/16 12:21 AM  Result Value Ref Range   Preg Test, Ur NEGATIVE NEGATIVE    Comment:        THE SENSITIVITY OF THIS METHODOLOGY IS >24 mIU/mL   Type and screen [facility]     Status: None (Preliminary result)   Collection Time: 01/28/16  1:52 AM  Result Value Ref Range   ABO/RH(D) A POS    Antibody Screen NEG    Sample Expiration 01/31/2016    Unit Number [id number]    Blood Component Type RED CELLS,LR    Unit division 00    Status of Unit ISSUED     Transfusion Status OK TO TRANSFUSE    Crossmatch Result Compatible    Unit Number [id number]    Blood Component Type RED CELLS,LR    Unit division 00    Status of Unit ISSUED    Transfusion Status OK TO TRANSFUSE    Crossmatch Result Compatible   Prepare RBC     Status: None   Collection Time: 01/28/16  1:52 AM  Result Value Ref Range   Order Confirmation ORDER PROCESSED BY BLOOD BANK   ABO/Rh     Status: None   Collection Time: 01/28/16  1:53 AM  Result Value Ref Range   ABO/RH(D) A POS     Imaging:  Dg Chest 2 View  01/28/2016  CLINICAL DATA:  Chest and head pain.  Dyspnea. EXAM: CHEST  2 VIEW COMPARISON:  None. FINDINGS: The heart size and mediastinal contours are within normal limits. Both lungs are clear. The visualized skeletal structures are unremarkable. IMPRESSION: No active cardiopulmonary disease. Electronically Signed   By: Andreas Newport M.D.   On: 01/28/2016 00:50    Assessment Symptomatic anemia History of fibroid uterus Menorrhagia   Plan: 1. Symptomatic anemia - will admit to observation and transfuse 2 units PRBCs.  Normal cardiac workup noted in ER.  VSS currently.   Will reassess patient post-transfusion.  2. History of enlarged fibroid uterus and menorrhagia - patient currently not actively bleeding.  Discussion had on management options again. Notes that she was told by Dr. Enzo Bi that she would likely need a hysterectomy at some point, however at this time notes that she is not ready for that. Would like to try Depo Provera. Discussed risks vs benefits.  Patient  notes understanding  Will also repeat pelvic ultrasound as it has been over 1 year since last ultrasound performed.     Rubie Maid, MD [facility]

## 2016-01-28 NOTE — ED Provider Notes (Signed)
[facility] Emergency Department Provider Note  Time seen: 2:44 AM  I have reviewed the triage vital signs and the nursing notes.   HISTORY  Chief Complaint Shortness of Breath and Chest Pain    HPI Anita [name] is a 30 y.o. female with a past medical history of fibroids, ectopic pregnancy status postb/l salpingectomy, who presents the emergency department with headaches and shortness of breath. According to the patient she has been experiencing shortness breath much worse with exertion, and occasional chest pain with exertion that began today. Patient has been having headaches for the past several weeks, especially with any type of exertion. Patient was told she would likely need her uterus out due to heavy periods and anemia. Patient is supposed be taking iron supplements. Patient states her last period was 01/09/16. Denies any bleeding since. Denies any black or bloody stool. Denies any nausea, diaphoresis.     Past Medical History  Diagnosis Date  . Anemia   . Fibroid uterus   . S/P tubal ligation   . Heavy periods   . Pelvic peritoneal adhesions, female   . S/P ectopic pregnancy 12/19/2014    Left tubal pregnancy, excised.    Patient Active Problem List   Diagnosis Date Noted  . Anemia 05/05/2015  . Fibroids 05/05/2015  . Menorrhagia 05/05/2015  . Pelvic peritoneal adhesions, female 05/05/2015  . Status post ectopic pregnancy 05/05/2015    History reviewed. No pertinent past surgical history.  Current Outpatient Rx  Name  Route  Sig  Dispense  Refill  . docusate sodium (COLACE) 100 MG capsule   Oral   Take 100 mg by mouth 2 (two) times daily.         . ferrous sulfate 325 (65 FE) MG tablet   Oral   Take 325 mg by mouth daily.         Marland Kitchen ibuprofen (ADVIL,MOTRIN) 800 MG tablet   Oral   Take 800 mg by mouth every 8 (eight) hours as needed.         Marland Kitchen levonorgestrel-ethinyl estradiol (AVIANE,ALESSE,LESSINA) 0.1-20 MG-MCG tablet  Oral   Take 1 tablet by mouth daily.   3 Package   1     Allergies Progestins  Family History  Problem Relation Age of Onset  . Liver cancer Mother   . Diabetes Paternal Grandmother   . Heart disease Neg Hx     Social History Social History  Substance Use Topics  . Smoking status: Never Smoker   . Smokeless tobacco: None  . Alcohol Use: No    Review of Systems Constitutional: Negative for fever. Cardiovascular: Positive for chest pain tonight, now resolved. Respiratory: Positive for shortness of breath especially with exertion. Gastrointestinal: Negative for abdominal pain Musculoskeletal: Negative for back pain. Neurological: Intermittent headaches. Denies any currently. 10-point ROS otherwise negative.  ____________________________________________   PHYSICAL EXAM:  VITAL SIGNS: ED Triage Vitals  Enc Vitals Group     BP 01/28/16 0008 150/61 mmHg     Pulse Rate 01/28/16 0008 87     Resp 01/28/16 0008 18     Temp 01/28/16 0008 98.5 F (36.9 C)     Temp Source 01/28/16 0008 Oral     SpO2 01/28/16 0008 99 %     Weight 01/28/16 0008 230 lb (104.327 kg)     Height 01/28/16 0008 5\' 9"  (1.753 m)     Head Cir --      Peak Flow --  Pain Score 01/28/16 0008 9     Pain Loc --      Pain Edu? --      Excl. in [place]? --     Constitutional: Alert and oriented. Well appearing and in no distress. Eyes: Normal exam ENT   Head: Normocephalic and atraumatic.   Mouth/Throat: Mucous membranes are moist. Cardiovascular: Normal rate, regular rhythm. No murmur Respiratory: Normal respiratory effort without tachypnea nor retractions. Breath sounds are clear  Gastrointestinal: Soft and nontender. No distention.   Musculoskeletal: Nontender with normal range of motion in all extremities.  Neurologic:  Normal speech and language. No gross focal neurologic deficits  Skin:  Skin is warm, dry and intact.  Psychiatric: Mood and affect are normal. Speech and behavior are  normal.   ____________________________________________    EKG  EKG reviewed and interpreted by myself shows normal sinus rhythm at 82 bpm, narrow QRS, normal axis, normal intervals, nonspecific ST changes. No ST elevations or depressions.  Chest x-ray negative.   INITIAL IMPRESSION / ASSESSMENT AND PLAN / ED COURSE  Pertinent labs & imaging results that were available during my care of the patient were reviewed by me and considered in my medical decision making (see chart for details).  Patient presents to the emergency department for shortness of breath and chest pain. Also with nearly daily headaches for the past several weeks. States her shortness of breath occurs with exertion. States she becomes very fatigued and winded with any type of exertion. Patient's labs are resulted showing a hemoglobin of 5.6. Denies any rectal bleeding, black or bloody stool. Given the patient's history of menorrhagia, highly suspect fibroid uterus to be the cause of her anemia. Patient's surgery was performed last year by Dr. Enzo Bi, we will discuss with OB for further recommendations. I consented the patient for blood transfusion.  I discussed the patient with Dr. Marcelline Mates who is on-call for Dr. Enzo Bi, she will admit to the hospital for further workup and blood transfusion.  ____________________________________________   FINAL CLINICAL IMPRESSION(S) / ED DIAGNOSES  Anemia Menorrhagia Symptomatic anemia  Harvest Dark, MD 01/28/16 [phone]

## 2016-01-28 NOTE — Discharge Summary (Signed)
Physician (Obstetrics/Gynecology) Discharge Summary  Patient ID: Anita [name] MRN: [id number] DOB/AGE: [birth year] 30 y.o.  Admit date: 01/28/2016 Discharge date: 01/28/2016  Admission Diagnoses: Menorrhagia with regular cycle [N92.0] Symptomatic anemia [D64.9], Fibroid Uterus   Discharge Diagnoses:  Principal Problem:   Symptomatic anemia Active Problems:   Fibroids   Menorrhagia   Discharged Condition: fair  Hospital Course: The patient was admitted to the hospital from the Emergency Room for management of symptomatic anemia (shortness of breath, chest pain on exertion).  At time of presentation her Hgb was noted to be 5.6. She was given a total of 4 units of PRBCs, with increase in Hgb to 8.1.  Patient was no longer noted to be symptomatic, and deemed stable to discharge home.  She was given an injection of Depo Provera to help manage future menorrhagia episodes after discharge.   Consults: None  Significant Diagnostic Studies: labs: see below and radiology: CXR: normal  Treatments: IV hydration and blood products, and medications: Depo Proverea 150 mg IM x 1 dose  Discharge Exam: Blood pressure 122/63, pulse 87, temperature 98.7 F (37.1 C), temperature source Oral, resp. rate 18, height 5\' 9"  (1.753 m), weight 230 lb (104.327 kg), last menstrual period 01/09/2016, SpO2 99 %. General appearance: alert and no distress Head: Normocephalic, without obvious abnormality, atraumatic Neck: no adenopathy, no carotid bruit, no JVD, supple, symmetrical, trachea midline and thyroid not enlarged, symmetric, no tenderness/mass/nodules Back: symmetric, no curvature. ROM normal. No CVA tenderness. Resp: clear to auscultation bilaterally Cardio: regular rate and rhythm, S1, S2 normal, no murmur, click, rub or gallop GI: soft, non-tender; bowel sounds normal; no masses,  no organomegaly Pelvic: cervix normal in appearance, external genitalia normal, no adnexal masses or tenderness, no  cervical motion tenderness, rectovaginal septum normal, vagina normal without discharge and Uterus enlarged to ~ 16 cm, mobile, nontender, slightly deviated to right Extremities: extremities normal, atraumatic, no cyanosis or edema Pulses: 2+ and symmetric Skin: Skin color, texture, turgor normal. No rashes or lesions Lymph nodes: Cervical, supraclavicular, and axillary nodes normal. Neurologic: Grossly normal  Disposition: Home     Medication List    TAKE these medications        docusate sodium 100 MG capsule  Commonly known as:  COLACE  Take 1 capsule (100 mg total) by mouth 2 (two) times daily as needed for mild constipation.     ferrous sulfate 325 (65 FE) MG tablet  Commonly known as:  FERROUSUL  Take 1 tablet (325 mg total) by mouth 3 (three) times daily with meals.           Follow-up Information    Follow up with Brayton Mars, MD In 4 weeks.   Specialties:  Obstetrics and Gynecology, Radiology   Why:  For follow-up of anemia and fibroid uterus and for ultrasound   Contact information:   [address] [place] [address] [phone]       Signed: Rubie Maid 01/28/2016, 8:10 PM

## 2016-01-28 NOTE — ED Notes (Signed)
Patient ambulatory to triage with steady gait, without difficulty or distress noted; pt reports intermittently having pain in head "if I laugh too hard"; st seen for same recently and was dx with anemia r/t ectopic; currently having SHOB and mid CP that began today

## 2016-01-29 LAB — TYPE AND SCREEN
ABO/RH(D): A POS
Antibody Screen: NEGATIVE
UNIT DIVISION: 0
Unit division: 0
Unit division: 0
Unit division: 0
Unit division: 0
Unit division: 0

## 2016-01-29 LAB — PREPARE RBC (CROSSMATCH)

## 2016-02-18 ENCOUNTER — Encounter: Payer: Self-pay | Admitting: Obstetrics and Gynecology

## 2016-02-18 ENCOUNTER — Ambulatory Visit (INDEPENDENT_AMBULATORY_CARE_PROVIDER_SITE_OTHER): Payer: Medicaid Other | Admitting: Obstetrics and Gynecology

## 2016-02-18 VITALS — BP 110/74 | HR 74 | Ht 69.0 in | Wt 271.9 lb

## 2016-02-18 DIAGNOSIS — N920 Excessive and frequent menstruation with regular cycle: Secondary | ICD-10-CM

## 2016-02-18 DIAGNOSIS — D649 Anemia, unspecified: Secondary | ICD-10-CM

## 2016-02-18 DIAGNOSIS — D259 Leiomyoma of uterus, unspecified: Secondary | ICD-10-CM | POA: Diagnosis not present

## 2016-02-18 NOTE — Patient Instructions (Addendum)
1. Continue with iron supplementation twice a day 2. Blood work to check anemia is drawn today 3. Hysterectomy scheduled-TAH 4. Return for preop appointment

## 2016-02-18 NOTE — Progress Notes (Addendum)
Patient ID: Anita [name], female   DOB: 01/07/1986, 30 y.o.   MRN: [id number] GYN ENCOUNTER NOTE  Subjective:       Anita [name] is a 30 y.o. [id number] female is here for gynecologic evaluation of the following issues:  1. F/u ER Visit 01/28/2016 2.  Menorrhagia  3. Symptomatic Anemia 4. Fibroid Uterus  Patient is 30yo female, s/p ectopic pregnancy,Status post laparoscopic bilateral salpingectomies, Depo-provera given on 01/29/2016, she presents today for follow up with complaints of shortness of breath with exertion, fatigue, headache, dizziness with standing, decreased appetite, menorrhagia, and dysmenorrhea.   Patient states slight improvement in menorrhagia, but still requires 4+ depends daily during menses. Recent hospitalization notable for 4 units packed red blood cell transfusion    Gynecologic History Patient's last menstrual period was 02/11/2016 (exact date). Contraception: Depo-Provera injections and tubal ligation Last Pap: 2014. Results were: normal Menorrhagia with normal cycle Interval: 28 days Duration: 5-6 days History of 4 unit RBC transfusion   Obstetric History OB History  Gravida Para Term Preterm AB SAB TAB Ectopic Multiple Living  4 3 2 1 1   1 1 2     # Outcome Date GA Lbr Len/2nd Weight Sex Delivery Anes PTL Lv  4 Ectopic 2016          3 Term 2013   5 lb 1.9 oz (2.322 kg) F Vag-Spont   Y  2 Term 2006   5 lb 1.8 oz (2.318 kg) F Vag-Spont   Y  1A Preterm 2005    M Vag-Spont   FD  1B Preterm 2005    M    FD      Past Medical History  Diagnosis Date  . Anemia   . Fibroid uterus   . S/P tubal ligation   . Heavy periods   . Pelvic peritoneal adhesions, female   . S/P ectopic pregnancy 12/19/2014    Left tubal pregnancy, excised.    Past Surgical History  Procedure Laterality Date  . Tubal ligation    . Bilateral salpingectomy  12/2014    s/p left ectopic pregnancy    Current Outpatient Prescriptions on File Prior to Visit  Medication Sig  Dispense Refill  . docusate sodium (COLACE) 100 MG capsule Take 1 capsule (100 mg total) by mouth 2 (two) times daily as needed for mild constipation. 60 capsule 2  . ferrous sulfate (FERROUSUL) 325 (65 FE) MG tablet Take 1 tablet (325 mg total) by mouth 3 (three) times daily with meals. 90 tablet 2   No current facility-administered medications on file prior to visit.    Allergies  Allergen Reactions  . Progestins Hives    Social History   Social History  . Marital Status: Single    Spouse Name: N/A  . Number of Children: N/A  . Years of Education: N/A   Occupational History  . Not on file.   Social History Main Topics  . Smoking status: Never Smoker   . Smokeless tobacco: Not on file  . Alcohol Use: No  . Drug Use: No  . Sexual Activity: Yes    Birth Control/ Protection: None   Other Topics Concern  . Not on file   Social History Narrative    Family History  Problem Relation Age of Onset  . Liver cancer Mother   . Diabetes Paternal Grandmother   . Heart disease Neg Hx     The following portions of the patient's history were reviewed and updated as appropriate:  allergies, current medications, past family history, past medical history, past social history, past surgical history and problem list.  Review of Systems Review of Systems - Negative except for those reviewed in HPI. Review of Systems - General ROS: negative for - chills, fatigue, fever, hot flashes, malaise or night sweats Hematological and Lymphatic ROS: negative for - bleeding problems or swollen lymph nodes Gastrointestinal ROS: negative for - abdominal pain, blood in stools, change in bowel habits and nausea/vomiting Musculoskeletal ROS: negative for - joint pain, muscle pain or muscular weakness Genito-Urinary ROS: negative for - change in menstrual cycle, dyspareunia, dysuria, genital discharge, genital ulcers, hematuria, incontinence, nocturia or pelvic   Objective:   BP 110/74 mmHg  Pulse 74   Ht 5\' 9"  (1.753 m)  Wt 271 lb 14.4 oz (123.333 kg)  BMI 40.13 kg/m2  LMP 02/11/2016 (Exact Date) CONSTITUTIONAL: Well-developed, well-nourished obese female in no acute distress.  HENT:  Normocephalic, atraumatic.  SKIN: Skin is warm and dry. No rash noted. Not diaphoretic. No erythema. No pallor. [place]: Alert and oriented to person, place, and time.  PSYCHIATRIC: Normal mood and affect. Normal behavior. Normal judgment and thought content. CARDIOVASCULAR: regular rate and rhythm, no murmur, rubs or gallops RESPIRATORY: clear to auscultation bilaterally.  Non-labored, even and symmetric breathing. ABDOMEN: Soft, non distended; Non tender.  No Organomegaly. Moderate pannus PELVIC: Anthropoid  External Genitalia: Normal  BUS: Normal  Vagina: Normal, abundent menstural blood in vaginal vault  Cervix: No cervical motion tenderness  Uterus:mobile, globular,16 week size   Adnexa: not palpable  RV: not examined  Bladder: Nontender MUSCULOSKELETAL: Normal range of motion. No tenderness.  No cyanosis, clubbing, or edema.  TRANSVAGINAL ULTRASOUND: IMPRESSION: Large mass at the anterior fundus compatible with a large leiomyoma up to 6.5 cm in size, extending submucosal.  Nonvisualization RIGHT ovary.  Unremarkable LEFT ovary.    Assessment:   1. Menorrhagia with regular cycle  -given Depo-pre vara  3. Symptomatic Anemia; Status post recent hospitalization with 4 units transfusion   -check cbc  4. Fibroid Uterus, 16 week size    Plan:  Discussed assessment and treatment options with patient including risks and benefits of different management options.  1. Continue with iron supplementation twice a day. 2. Blood work to check anemia is drawn today. 3. Schedule hysterectomy in the next 2 - 4 weeks. TAH  4. Return for preop appointment.  A total of 15 minutes were spent face-to-face with the patient during this encounter and over half of that time dealt with counseling  and coordination of care.   Lindsay P. Penninger PA-S Brayton Mars, MD   I have seen, interviewed, and examined the patient in conjunction with the [facility]A. student and affirm the diagnosis and management plan. Boniface Goffe A. Revia Nghiem, MD, FACOG   Note: This dictation was prepared with Dragon dictation along with smaller phrase technology. Any transcriptional errors that result from this process are unintentional.

## 2016-02-20 LAB — CBC WITH DIFFERENTIAL/PLATELET
BASOS ABS: 0 10*3/uL (ref 0.0–0.2)
Basos: 1 %
EOS (ABSOLUTE): 0.3 10*3/uL (ref 0.0–0.4)
Eos: 4 %
Hematocrit: 37.2 % (ref 34.0–46.6)
Hemoglobin: 11.2 g/dL (ref 11.1–15.9)
IMMATURE GRANULOCYTES: 0 %
Immature Grans (Abs): 0 10*3/uL (ref 0.0–0.1)
LYMPHS ABS: 1.6 10*3/uL (ref 0.7–3.1)
Lymphs: 25 %
MCH: 22.8 pg — AB (ref 26.6–33.0)
MCHC: 30.1 g/dL — ABNORMAL LOW (ref 31.5–35.7)
MCV: 76 fL — ABNORMAL LOW (ref 79–97)
MONOCYTES: 7 %
Monocytes Absolute: 0.5 10*3/uL (ref 0.1–0.9)
NEUTROS PCT: 63 %
Neutrophils Absolute: 3.9 10*3/uL (ref 1.4–7.0)
Platelets: 632 10*3/uL — ABNORMAL HIGH (ref 150–379)
RBC: 4.92 x10E6/uL (ref 3.77–5.28)
WBC: 6.2 10*3/uL (ref 3.4–10.8)

## 2016-02-22 ENCOUNTER — Telehealth: Payer: Self-pay | Admitting: Obstetrics and Gynecology

## 2016-02-22 NOTE — Telephone Encounter (Signed)
Spoke with pt- see lab results  

## 2016-02-22 NOTE — Telephone Encounter (Signed)
Pt called and wanted her lab results, said she had surgery next month and wanted to know if they were ok?

## 2016-02-24 ENCOUNTER — Telehealth: Payer: Self-pay

## 2016-02-24 NOTE — Telephone Encounter (Signed)
Letter faxed to [place] [employer]. Anita [name] with confirmation. Unable to leave vm on pts cell. Will try later.

## 2016-02-25 NOTE — Telephone Encounter (Signed)
Snead informed pt that letter was faxed.

## 2016-02-29 ENCOUNTER — Telehealth: Payer: Self-pay | Admitting: Obstetrics and Gynecology

## 2016-02-29 NOTE — Telephone Encounter (Signed)
Patient called requesting another letter stating when her surgery is, how long she will be out and when she can return to work. This letter is needed to give to social services to cover her living expenses while she is out of work. Thanks

## 2016-02-29 NOTE — Telephone Encounter (Signed)
Pt stopped by office and picked up letter.

## 2016-03-22 ENCOUNTER — Encounter: Payer: Self-pay | Admitting: Obstetrics and Gynecology

## 2016-03-22 ENCOUNTER — Encounter
Admission: RE | Admit: 2016-03-22 | Discharge: 2016-03-22 | Disposition: A | Discharge status: Home / Self Care | Payer: Medicaid Other | Source: Ambulatory Visit | Attending: Obstetrics and Gynecology | Admitting: Obstetrics and Gynecology

## 2016-03-22 ENCOUNTER — Ambulatory Visit (INDEPENDENT_AMBULATORY_CARE_PROVIDER_SITE_OTHER): Payer: Self-pay | Admitting: Obstetrics and Gynecology

## 2016-03-22 VITALS — BP 138/82 | HR 69 | Ht 68.0 in | Wt 274.8 lb

## 2016-03-22 DIAGNOSIS — D259 Leiomyoma of uterus, unspecified: Secondary | ICD-10-CM

## 2016-03-22 DIAGNOSIS — N920 Excessive and frequent menstruation with regular cycle: Secondary | ICD-10-CM

## 2016-03-22 DIAGNOSIS — D649 Anemia, unspecified: Secondary | ICD-10-CM

## 2016-03-22 DIAGNOSIS — Z0289 Encounter for other administrative examinations: Secondary | ICD-10-CM

## 2016-03-22 DIAGNOSIS — Z01812 Encounter for preprocedural laboratory examination: Secondary | ICD-10-CM | POA: Diagnosis present

## 2016-03-22 DIAGNOSIS — Z01818 Encounter for other preprocedural examination: Secondary | ICD-10-CM

## 2016-03-22 HISTORY — DX: Unspecified asthma, uncomplicated: J45.909

## 2016-03-22 LAB — RAPID HIV SCREEN (HIV 1/2 AB+AG)
HIV 1/2 ANTIBODIES: NONREACTIVE
HIV-1 P24 ANTIGEN - HIV24: NONREACTIVE

## 2016-03-22 LAB — CBC WITH DIFFERENTIAL/PLATELET
BASOS ABS: 0 10*3/uL (ref 0–0.1)
BASOS PCT: 1 %
EOS ABS: 0.2 10*3/uL (ref 0–0.7)
Eosinophils Relative: 3 %
HEMATOCRIT: 35.2 % (ref 35.0–47.0)
Hemoglobin: 11.7 g/dL — ABNORMAL LOW (ref 12.0–16.0)
Lymphocytes Relative: 24 %
Lymphs Abs: 1.2 10*3/uL (ref 1.0–3.6)
MCH: 26.4 pg (ref 26.0–34.0)
MCHC: 33.1 g/dL (ref 32.0–36.0)
MCV: 79.5 fL — ABNORMAL LOW (ref 80.0–100.0)
MONO ABS: 0.4 10*3/uL (ref 0.2–0.9)
Monocytes Relative: 8 %
NEUTROS ABS: 3.1 10*3/uL (ref 1.4–6.5)
NEUTROS PCT: 64 %
Platelets: 298 10*3/uL (ref 150–440)
RBC: 4.42 MIL/uL (ref 3.80–5.20)
RDW: 26 % — AB (ref 11.5–14.5)
WBC: 4.8 10*3/uL (ref 3.6–11.0)

## 2016-03-22 LAB — BASIC METABOLIC PANEL
ANION GAP: 5 (ref 5–15)
BUN: 14 mg/dL (ref 6–20)
CHLORIDE: 107 mmol/L (ref 101–111)
CO2: 24 mmol/L (ref 22–32)
Calcium: 8.6 mg/dL — ABNORMAL LOW (ref 8.9–10.3)
Creatinine, Ser: 0.87 mg/dL (ref 0.44–1.00)
GFR calc Af Amer: >60 mL/min (ref >60)
GFR calc non Af Amer: >60 mL/min (ref >60)
Glucose, Bld: 81 mg/dL (ref 65–99)
POTASSIUM: 3.8 mmol/L (ref 3.5–5.1)
Sodium: 136 mmol/L (ref 135–145)

## 2016-03-22 LAB — TYPE AND SCREEN
ABO/RH(D): A POS
Antibody Screen: NEGATIVE
Extend sample reason: TRANSFUSED

## 2016-03-22 NOTE — Patient Instructions (Signed)
  Your procedure is scheduled on: 04/04/16 Mon Report to Same Day Surgery 2nd floor medical mall To find out your arrival time please call [phone] between 1PM - 3PM on 04/01/16 Fri  Remember: Instructions that are not followed completely may result in serious medical risk, up to and including death, or upon the discretion of your surgeon and anesthesiologist your surgery may need to be rescheduled.    _x___ 1. Do not eat food or drink liquids after midnight. No gum chewing or hard candies.     __x__ 2. No Alcohol for 24 hours before or after surgery.   ____ 3. Bring all medications with you on the day of surgery if instructed.    __x__ 4. Notify your doctor if there is any change in your medical condition     (cold, fever, infections).     Do not wear jewelry, make-up, hairpins, clips or nail polish.  Do not wear lotions, powders, or perfumes. You may wear deodorant.  Do not shave 48 hours prior to surgery. Men may shave face and neck.  Do not bring valuables to the hospital.    [facility] is not responsible for any belongings or valuables.               Contacts, dentures or bridgework may not be worn into surgery.  Leave your suitcase in the car. After surgery it may be brought to your room.  For patients admitted to the hospital, discharge time is determined by your treatment team.   Patients discharged the day of surgery will not be allowed to drive home.    Please read over the following fact sheets that you were given:   [facility] Preparing for Surgery and or MRSA Information   _x___ Take these medicines the morning of surgery with A SIP OF WATER:    1. None  2.  3.  4.  5.  6.  ____ Fleet Enema (as directed)   _x___ Use CHG Soap or sage wipes as directed on instruction sheet   ____ Use inhalers on the day of surgery and bring to hospital day of surgery  ____ Stop metformin 2 days prior to surgery    ____ Take 1/2 of usual insulin dose the night before  surgery and none on the morning of           surgery.   ____ Stop aspirin or coumadin, or plavix  _x__ Stop Anti-inflammatories such as Advil, Aleve, Ibuprofen, Motrin, Naproxen,          Naprosyn, Goodies powders or aspirin products. Ok to take Tylenol.   ____ Stop supplements until after surgery.    ____ Bring C-Pap to the hospital.

## 2016-03-22 NOTE — Patient Instructions (Signed)
1. Return on 04/12/2016 for postop check

## 2016-03-22 NOTE — H&P (Signed)
Subjective:   PREOPERATIVE HISTORY AND PHYSICAL    Patient is a 30 y.o. [id number] Scheduled for TAH, bilateral salpingectomy on 03/25/2016 for symptomatic fibroid uterus.  Patient does report multiple transfusions in the past for symptomatic anemia.  Conservative surgery as well as medical therapy have been ineffective in controlling her disease.  She is ready and willing to proceed with definitive surgery.  On 12/19/2014, patient underwent surgery for tubal pregnancy.  Findings at that time included 16 week size globular uterus, which extended to the pelvic sidewalls.  Intraoperative determination at that time was that the patient should have an open hysterectomy due to anatomic limitations identified at laparoscopy.  TRANSVAGINAL ULTRASOUND: IMPRESSION: Large mass at the anterior fundus compatible with a large leiomyoma up to 6.5 cm in size, extending submucosal.  Nonvisualization RIGHT ovary.  Unremarkable LEFT ovary.   Pertinent Gynecological History: Patient's last menstrual period was 02/11/2016 (exact date). Contraception: Depo-Provera injections and tubal ligation Last Pap: 2014. Results were: normal Menorrhagia with normal cycle Interval: 28 days Duration: 5-6 days History of 4 unit RBC transfusion  Discussed Blood/Blood Products: yes   Menstrual History: OB History    Gravida Para Term Preterm AB TAB SAB Ectopic Multiple Living   4 3 2 1 1   1 1 2       Menarche age: NA  Patient's last menstrual period was 03/15/2016.    Past Medical History  Diagnosis Date  . Anemia   . Fibroid uterus   . S/P tubal ligation   . Heavy periods   . Pelvic peritoneal adhesions, female   . S/P ectopic pregnancy 12/19/2014    Left tubal pregnancy, excised.  . Asthma     Past Surgical History  Procedure Laterality Date  . Tubal ligation    . Bilateral salpingectomy  12/2014    s/p left ectopic pregnancy    OB History  Gravida Para Term Preterm AB SAB TAB Ectopic Multiple  Living  4 3 2 1 1   1 1 2     # Outcome Date GA Lbr Len/2nd Weight Sex Delivery Anes PTL Lv  4 Ectopic 2016          3 Term 2013   5 lb 1.9 oz (2.322 kg) F Vag-Spont   Y  2 Term 2006   5 lb 1.8 oz (2.318 kg) F Vag-Spont   Y  1A Preterm 2005    M Vag-Spont   FD  1B Preterm 2005    M    FD      Social History   Social History  . Marital Status: Single    Spouse Name: N/A  . Number of Children: N/A  . Years of Education: N/A   Social History Main Topics  . Smoking status: Never Smoker   . Smokeless tobacco: None  . Alcohol Use: Yes     Comment: occ.  . Drug Use: No  . Sexual Activity: Yes    Birth Control/ Protection: None   Other Topics Concern  . None   Social History Narrative    Family History  Problem Relation Age of Onset  . Liver cancer Mother   . Diabetes Paternal Grandmother   . Heart disease Neg Hx      (Not in a hospital admission)  Allergies  Allergen Reactions  . Progestins Hives    Review of Systems Constitutional: No recent fever/chills/sweats Respiratory: No recent cough/bronchitis Cardiovascular: No chest pain Gastrointestinal: No recent nausea/vomiting/diarrhea Genitourinary: No UTI symptoms Hematologic/lymphatic:No history  of coagulopathy or recent blood thinner use    Objective:    BP 138/82 mmHg  Pulse 69  Ht 5\' 8"  (1.727 m)  Wt 274 lb 12.8 oz (124.648 kg)  BMI 41.79 kg/m2  LMP 03/15/2016  General:   Normal  Skin:   normal  HEENT:  Normal  Neck:  Supple without Adenopathy or Thyromegaly  Lungs:   Heart:              Breasts:   Abdomen:  Pelvis:  M/S   Extremeties:  Neuro:    clear to auscultation bilaterally   Normal without murmur   Not Examined   soft, non-tender; bowel sounds normal; no masses,  no organomegaly   Exam deferred to OR  No CVAT  Warm/Dry   Normal       02/18/2016 PELVIC: Anthropoid External Genitalia: Normal BUS: Normal Vagina: Normal, abundent menstural  blood in vaginal vault Cervix: No cervical motion tenderness Uterus:mobile, globular,16 week size  Adnexa: not palpable RV: not examined Bladder: Nontender  Assessment:    1.  16 week fibroid uterus. 2.  Chronic anemia secondary to menorrhagia   Plan:  Total abdominal hysterectomy, bilateral salpingectomy  Preop counseling: On 04/04/2016.  Patient is to undergo TAH, bilateral salpingectomy for  Symptomatic fibroid uterus.She is understanding of the planned procedure and is aware of and is accepting of all surgical risks which include but are not limited to bleeding, infection, pelvic organs with need for repair, blood clot disorders, anesthesia risks, etc.  All questions have been answered.  Informed consent is given.  Patient is ready and willing to proceed with surgery as scheduled.  Brayton Mars, MD  Note: This dictation was prepared with Dragon dictation along with smaller phrase technology. Any transcriptional errors that result from this process are unintentional.

## 2016-03-22 NOTE — Progress Notes (Signed)
Subjective:   PREOPERATIVE HISTORY AND PHYSICAL    Patient is a 30 y.o. G4P2129female Scheduled for TAH, bilateral salpingectomy on 03/25/2016 for symptomatic fibroid uterus.  Patient does report multiple transfusions in the past for symptomatic anemia.  Conservative surgery as well as medical therapy have been ineffective in controlling her disease.  She is ready and willing to proceed with definitive surgery.  On 12/19/2014, patient underwent surgery for tubal pregnancy.  Findings at that time included 16 week size globular uterus, which extended to the pelvic sidewalls.  Intraoperative determination at that time was that the patient should have an open hysterectomy due to anatomic limitations identified at laparoscopy.  TRANSVAGINAL ULTRASOUND: IMPRESSION: Large mass at the anterior fundus compatible with a large leiomyoma up to 6.5 cm in size, extending submucosal.  Nonvisualization RIGHT ovary.  Unremarkable LEFT ovary.   Pertinent Gynecological History: Patient's last menstrual period was 02/11/2016 (exact date). Contraception: Depo-Provera injections and tubal ligation Last Pap: 2014. Results were: normal Menorrhagia with normal cycle Interval: 28 days Duration: 5-6 days History of 4 unit RBC transfusion  Discussed Blood/Blood Products: yes   Menstrual History: OB History    Gravida Para Term Preterm AB TAB SAB Ectopic Multiple Living   4 3 2 1 1   1 1 2       Menarche age: NA  Patient's last menstrual period was 03/15/2016.    Past Medical History  Diagnosis Date  . Anemia   . Fibroid uterus   . S/P tubal ligation   . Heavy periods   . Pelvic peritoneal adhesions, female   . S/P ectopic pregnancy 12/19/2014    Left tubal pregnancy, excised.  . Asthma     Past Surgical History  Procedure Laterality Date  . Tubal ligation    . Bilateral salpingectomy  12/2014    s/p left ectopic pregnancy    OB History  Gravida Para Term Preterm AB SAB TAB Ectopic Multiple  Living  4 3 2 1 1   1 1 2     # Outcome Date GA Lbr Len/2nd Weight Sex Delivery Anes PTL Lv  4 Ectopic 2016          3 Term 2013   5 lb 1.9 oz (2.322 kg) F Vag-Spont   Y  2 Term 2006   5 lb 1.8 oz (2.318 kg) F Vag-Spont   Y  1A Preterm 2005    M Vag-Spont   FD  1B Preterm 2005    M    FD      Social History   Social History  . Marital Status: Single    Spouse Name: N/A  . Number of Children: N/A  . Years of Education: N/A   Social History Main Topics  . Smoking status: Never Smoker   . Smokeless tobacco: None  . Alcohol Use: Yes     Comment: occ.  . Drug Use: No  . Sexual Activity: Yes    Birth Control/ Protection: None   Other Topics Concern  . None   Social History Narrative    Family History  Problem Relation Age of Onset  . Liver cancer Mother   . Diabetes Paternal Grandmother   . Heart disease Neg Hx      (Not in a hospital admission)  Allergies  Allergen Reactions  . Progestins Hives    Review of Systems Constitutional: No recent fever/chills/sweats Respiratory: No recent cough/bronchitis Cardiovascular: No chest pain Gastrointestinal: No recent nausea/vomiting/diarrhea Genitourinary: No UTI symptoms Hematologic/lymphatic:No history  of coagulopathy or recent blood thinner use    Objective:    BP 138/82 mmHg  Pulse 69  Ht 5\' 8"  (1.727 m)  Wt 274 lb 12.8 oz (124.648 kg)  BMI 41.79 kg/m2  LMP 03/15/2016  General:   Normal  Skin:   normal  HEENT:  Normal  Neck:  Supple without Adenopathy or Thyromegaly  Lungs:   Heart:              Breasts:   Abdomen:  Pelvis:  M/S   Extremeties:  Neuro:    clear to auscultation bilaterally   Normal without murmur   Not Examined   soft, non-tender; bowel sounds normal; no masses,  no organomegaly   Exam deferred to OR  No CVAT  Warm/Dry   Normal       02/18/2016 PELVIC: Anthropoid External Genitalia: Normal BUS: Normal Vagina: Normal, abundent menstural  blood in vaginal vault Cervix: No cervical motion tenderness Uterus:mobile, globular,16 week size  Adnexa: not palpable RV: not examined Bladder: Nontender  Assessment:    1.  16 week fibroid uterus. 2.  Chronic anemia secondary to menorrhagia   Plan:  Total abdominal hysterectomy, bilateral salpingectomy  Preop counseling: On 04/04/2016.  Patient is to undergo TAH, bilateral salpingectomy for  Symptomatic fibroid uterus.She is understanding of the planned procedure and is aware of and is accepting of all surgical risks which include but are not limited to bleeding, infection, pelvic organs with need for repair, blood clot disorders, anesthesia risks, etc.  All questions have been answered.  Informed consent is given.  Patient is ready and willing to proceed with surgery as scheduled.  Brayton Mars, MD  Note: This dictation was prepared with Dragon dictation along with smaller phrase technology. Any transcriptional errors that result from this process are unintentional.

## 2016-03-23 LAB — RPR: RPR Ser Ql: NONREACTIVE

## 2016-04-04 ENCOUNTER — Encounter: Payer: Self-pay | Admitting: *Deleted

## 2016-04-04 ENCOUNTER — Inpatient Hospital Stay: Payer: Medicaid Other | Admitting: Anesthesiology

## 2016-04-04 ENCOUNTER — Encounter
Admission: RE | Disposition: A | Discharge status: Home / Self Care | Payer: Self-pay | Source: Ambulatory Visit | Attending: Obstetrics and Gynecology

## 2016-04-04 ENCOUNTER — Inpatient Hospital Stay
Admission: RE | Admit: 2016-04-04 | Discharge: 2016-04-06 | DRG: 742 | Disposition: A | Discharge status: Home / Self Care | Payer: Medicaid Other | Source: Ambulatory Visit | Attending: Obstetrics and Gynecology | Admitting: Obstetrics and Gynecology

## 2016-04-04 DIAGNOSIS — Z833 Family history of diabetes mellitus: Secondary | ICD-10-CM | POA: Diagnosis not present

## 2016-04-04 DIAGNOSIS — J45909 Unspecified asthma, uncomplicated: Secondary | ICD-10-CM | POA: Diagnosis present

## 2016-04-04 DIAGNOSIS — D500 Iron deficiency anemia secondary to blood loss (chronic): Secondary | ICD-10-CM | POA: Diagnosis present

## 2016-04-04 DIAGNOSIS — Z888 Allergy status to other drugs, medicaments and biological substances status: Secondary | ICD-10-CM

## 2016-04-04 DIAGNOSIS — N801 Endometriosis of ovary: Secondary | ICD-10-CM | POA: Diagnosis present

## 2016-04-04 DIAGNOSIS — D259 Leiomyoma of uterus, unspecified: Secondary | ICD-10-CM | POA: Diagnosis present

## 2016-04-04 DIAGNOSIS — N920 Excessive and frequent menstruation with regular cycle: Secondary | ICD-10-CM

## 2016-04-04 DIAGNOSIS — N80109 Endometriosis of ovary, unspecified side, unspecified depth: Secondary | ICD-10-CM | POA: Diagnosis present

## 2016-04-04 DIAGNOSIS — Z9851 Tubal ligation status: Secondary | ICD-10-CM | POA: Diagnosis not present

## 2016-04-04 DIAGNOSIS — Z90710 Acquired absence of both cervix and uterus: Secondary | ICD-10-CM

## 2016-04-04 DIAGNOSIS — Z800 Family history of malignant neoplasm of digestive organs: Secondary | ICD-10-CM

## 2016-04-04 DIAGNOSIS — Z90721 Acquired absence of ovaries, unilateral: Secondary | ICD-10-CM

## 2016-04-04 DIAGNOSIS — D649 Anemia, unspecified: Secondary | ICD-10-CM

## 2016-04-04 DIAGNOSIS — O001 Tubal pregnancy without intrauterine pregnancy: Secondary | ICD-10-CM | POA: Diagnosis present

## 2016-04-04 HISTORY — PX: OOPHORECTOMY: SHX6387

## 2016-04-04 HISTORY — PX: HYSTERECTOMY ABDOMINAL WITH SALPINGECTOMY: SHX6725

## 2016-04-04 LAB — TYPE AND SCREEN
ABO/RH(D): A POS
ANTIBODY SCREEN: NEGATIVE

## 2016-04-04 LAB — POCT PREGNANCY, URINE: Preg Test, Ur: NEGATIVE

## 2016-04-04 SURGERY — HYSTERECTOMY, TOTAL, ABDOMINAL, WITH SALPINGECTOMY
Anesthesia: General | Site: Abdomen | Laterality: Left | Wound class: Clean Contaminated

## 2016-04-04 MED ORDER — ACETAMINOPHEN 10 MG/ML IV SOLN
INTRAVENOUS | Status: AC
  Filled 2016-04-04: qty 100

## 2016-04-04 MED ORDER — MORPHINE SULFATE (PF) 2 MG/ML IV SOLN
1.0000 mg | INTRAVENOUS | Status: DC | PRN
  Administered 2016-04-04 (×3): 2 mg via INTRAVENOUS
  Filled 2016-04-04 (×3): qty 1

## 2016-04-04 MED ORDER — FENTANYL CITRATE (PF) 100 MCG/2ML IJ SOLN
25.0000 ug | INTRAMUSCULAR | Status: AC | PRN
  Administered 2016-04-04 (×6): 25 ug via INTRAVENOUS

## 2016-04-04 MED ORDER — LIDOCAINE 5 % EX PTCH
1.0000 | MEDICATED_PATCH | CUTANEOUS | Status: DC
  Administered 2016-04-05 – 2016-04-06 (×2): 1 via TRANSDERMAL
  Filled 2016-04-04 (×2): qty 1

## 2016-04-04 MED ORDER — FENTANYL CITRATE (PF) 100 MCG/2ML IJ SOLN
INTRAMUSCULAR | Status: AC
  Administered 2016-04-04: 25 ug via INTRAVENOUS
  Filled 2016-04-04: qty 2

## 2016-04-04 MED ORDER — LIDOCAINE 5 % EX PTCH
MEDICATED_PATCH | CUTANEOUS | Status: DC | PRN
  Administered 2016-04-04: 1 via TRANSDERMAL

## 2016-04-04 MED ORDER — SUGAMMADEX SODIUM 200 MG/2ML IV SOLN
INTRAVENOUS | Status: DC | PRN
  Administered 2016-04-04: 248.6 mg via INTRAVENOUS

## 2016-04-04 MED ORDER — FENTANYL CITRATE (PF) 100 MCG/2ML IJ SOLN
INTRAMUSCULAR | Status: DC | PRN
  Administered 2016-04-04: 50 ug via INTRAVENOUS
  Administered 2016-04-04: 100 ug via INTRAVENOUS

## 2016-04-04 MED ORDER — ACETAMINOPHEN 10 MG/ML IV SOLN
INTRAVENOUS | Status: DC | PRN
  Administered 2016-04-04: 1000 mg via INTRAVENOUS

## 2016-04-04 MED ORDER — LACTATED RINGERS IV SOLN: INTRAVENOUS | Status: DC

## 2016-04-04 MED ORDER — DEXAMETHASONE SODIUM PHOSPHATE 10 MG/ML IJ SOLN
INTRAMUSCULAR | Status: DC | PRN
  Administered 2016-04-04: 8 mg via INTRAVENOUS

## 2016-04-04 MED ORDER — PROPOFOL 10 MG/ML IV BOLUS
INTRAVENOUS | Status: DC | PRN
  Administered 2016-04-04: 180 mg via INTRAVENOUS

## 2016-04-04 MED ORDER — ROCURONIUM BROMIDE 100 MG/10ML IV SOLN
INTRAVENOUS | Status: DC | PRN
  Administered 2016-04-04 (×2): 10 mg via INTRAVENOUS
  Administered 2016-04-04: 40 mg via INTRAVENOUS

## 2016-04-04 MED ORDER — LACTATED RINGERS IV SOLN
INTRAVENOUS | Status: DC
  Administered 2016-04-04 (×2): via INTRAVENOUS

## 2016-04-04 MED ORDER — DEXTROSE 5 % IV SOLN
3.0000 g | INTRAVENOUS | Status: AC
  Administered 2016-04-04: 3 g via INTRAVENOUS
  Filled 2016-04-04: qty 3000

## 2016-04-04 MED ORDER — SIMETHICONE 80 MG PO CHEW: 80.0000 mg | CHEWABLE_TABLET | Freq: Four times a day (QID) | ORAL | Status: DC | PRN

## 2016-04-04 MED ORDER — ONDANSETRON HCL 4 MG/2ML IJ SOLN
4.0000 mg | Freq: Four times a day (QID) | INTRAMUSCULAR | Status: DC | PRN
Start: 2016-04-04 — End: 2016-04-06

## 2016-04-04 MED ORDER — BISACODYL 10 MG RE SUPP: 10.0000 mg | Freq: Every day | RECTAL | Status: DC | PRN

## 2016-04-04 MED ORDER — LIDOCAINE HCL (CARDIAC) 20 MG/ML IV SOLN
INTRAVENOUS | Status: DC | PRN
  Administered 2016-04-04: 40 mg via INTRAVENOUS

## 2016-04-04 MED ORDER — DOCUSATE SODIUM 100 MG PO CAPS
100.0000 mg | ORAL_CAPSULE | Freq: Two times a day (BID) | ORAL | Status: DC
  Administered 2016-04-05 – 2016-04-06 (×4): 100 mg via ORAL
  Filled 2016-04-04 (×4): qty 1

## 2016-04-04 MED ORDER — ACETAMINOPHEN 325 MG PO TABS
650.0000 mg | ORAL_TABLET | ORAL | Status: DC | PRN
  Filled 2016-04-04: qty 2

## 2016-04-04 MED ORDER — FAMOTIDINE 20 MG PO TABS
ORAL_TABLET | ORAL | Status: AC
  Administered 2016-04-04: 20 mg via ORAL
  Filled 2016-04-04: qty 1

## 2016-04-04 MED ORDER — ALFENTANIL 500 MCG/ML IJ INJ
INJECTION | INTRAMUSCULAR | Status: DC | PRN
  Administered 2016-04-04 (×2): 500 ug via INTRAVENOUS

## 2016-04-04 MED ORDER — KETOROLAC TROMETHAMINE 30 MG/ML IJ SOLN
30.0000 mg | Freq: Four times a day (QID) | INTRAMUSCULAR | Status: DC
  Administered 2016-04-04 – 2016-04-06 (×7): 30 mg via INTRAVENOUS
  Filled 2016-04-04 (×7): qty 1

## 2016-04-04 MED ORDER — FAMOTIDINE 20 MG PO TABS
20.0000 mg | ORAL_TABLET | Freq: Once | ORAL | Status: AC
  Administered 2016-04-04: 20 mg via ORAL

## 2016-04-04 MED ORDER — ONDANSETRON HCL 4 MG/2ML IJ SOLN
INTRAMUSCULAR | Status: DC | PRN
  Administered 2016-04-04: 4 mg via INTRAVENOUS

## 2016-04-04 MED ORDER — LACTATED RINGERS IV SOLN
INTRAVENOUS | Status: DC
  Administered 2016-04-04 – 2016-04-06 (×5): via INTRAVENOUS

## 2016-04-04 MED ORDER — KETOROLAC TROMETHAMINE 30 MG/ML IJ SOLN
INTRAMUSCULAR | Status: DC | PRN
  Administered 2016-04-04: 30 mg via INTRAVENOUS

## 2016-04-04 MED ORDER — OXYCODONE-ACETAMINOPHEN 5-325 MG PO TABS
1.0000 | ORAL_TABLET | ORAL | Status: DC | PRN
  Administered 2016-04-04 – 2016-04-05 (×4): 2 via ORAL
  Administered 2016-04-05: 1 via ORAL
  Administered 2016-04-05 – 2016-04-06 (×7): 2 via ORAL
  Filled 2016-04-04 (×12): qty 2

## 2016-04-04 MED ORDER — MIDAZOLAM HCL 2 MG/2ML IJ SOLN
INTRAMUSCULAR | Status: DC | PRN
  Administered 2016-04-04: 1 mg via INTRAVENOUS

## 2016-04-04 MED ORDER — KETOROLAC TROMETHAMINE 30 MG/ML IJ SOLN: 30.0000 mg | Freq: Four times a day (QID) | INTRAMUSCULAR | Status: DC

## 2016-04-04 MED ORDER — ONDANSETRON HCL 4 MG/2ML IJ SOLN: 4.0000 mg | Freq: Once | INTRAMUSCULAR | Status: DC | PRN

## 2016-04-04 SURGICAL SUPPLY — 36 items
CANISTER SUCT 1200ML W/VALVE (MISCELLANEOUS) ×4 IMPLANT
CATH TRAY 16F METER LATEX (MISCELLANEOUS) ×4 IMPLANT
CHLORAPREP W/TINT 26ML (MISCELLANEOUS) ×4 IMPLANT
DRAPE LAPAROTOMY 100X77 ABD (DRAPES) ×4 IMPLANT
DRAPE LAPAROTOMY TRNSV 106X77 (MISCELLANEOUS) IMPLANT
DRSG TELFA 3X8 NADH (GAUZE/BANDAGES/DRESSINGS) ×4 IMPLANT
ELECT BLADE 6 FLAT ULTRCLN (ELECTRODE) IMPLANT
ELECT BLADE 6.5 EXT (BLADE) IMPLANT
ELECT CAUTERY BLADE 6.4 (BLADE) ×4 IMPLANT
ELECT REM PT RETURN 9FT ADLT (ELECTROSURGICAL) ×4
ELECTRODE REM PT RTRN 9FT ADLT (ELECTROSURGICAL) ×2 IMPLANT
GAUZE SPONGE 4X4 12PLY STRL (GAUZE/BANDAGES/DRESSINGS) ×4 IMPLANT
GLOVE BIO SURGEON STRL SZ 6 (GLOVE) ×16 IMPLANT
GLOVE INDICATOR 8.0 STRL GRN (GLOVE) ×4 IMPLANT
GOWN STRL REUS W/ TWL LRG LVL3 (GOWN DISPOSABLE) ×4 IMPLANT
GOWN STRL REUS W/ TWL XL LVL3 (GOWN DISPOSABLE) ×2 IMPLANT
GOWN STRL REUS W/TWL LRG LVL3 (GOWN DISPOSABLE) ×4
GOWN STRL REUS W/TWL XL LVL3 (GOWN DISPOSABLE) ×2
KIT RM TURNOVER STRD PROC AR (KITS) ×4 IMPLANT
LABEL OR SOLS (LABEL) ×4 IMPLANT
LIGASURE IMPACT 36 18CM CVD LR (INSTRUMENTS) ×4 IMPLANT
NS IRRIG 500ML POUR BTL (IV SOLUTION) ×12 IMPLANT
PACK BASIN MAJOR ARMC (MISCELLANEOUS) ×4 IMPLANT
RETRACTOR WND ALEXIS-O 25 LRG (MISCELLANEOUS) ×2 IMPLANT
RTRCTR WOUND ALEXIS O 25CM LRG (MISCELLANEOUS) ×4
STAPLER SKIN PROX 35W (STAPLE) ×4 IMPLANT
SUT CHROMIC 0 CT 1 (SUTURE) ×8 IMPLANT
SUT MAXON ABS #0 GS21 30IN (SUTURE) ×8 IMPLANT
SUT SILK 0 (SUTURE) ×2
SUT SILK 0 30XBRD TIE 6 (SUTURE) ×2 IMPLANT
SUT VIC AB 0 CT1 27 (SUTURE) ×4
SUT VIC AB 0 CT1 27XCR 8 STRN (SUTURE) ×4 IMPLANT
SUT VIC AB 2-0 CT1 (SUTURE) ×4 IMPLANT
TRAY PREP VAG/GEN (MISCELLANEOUS) ×4 IMPLANT
TUBING ART PRESS 48 MALE/FEM (TUBING) IMPLANT
WATER STERILE IRR 1000ML POUR (IV SOLUTION) IMPLANT

## 2016-04-04 NOTE — H&P (View-Only) (Signed)
Subjective:   PREOPERATIVE HISTORY AND PHYSICAL    Patient is a 30 y.o. G4P2117female Scheduled for TAH, bilateral salpingectomy on 03/25/2016 for symptomatic fibroid uterus.  Patient does report multiple transfusions in the past for symptomatic anemia.  Conservative surgery as well as medical therapy have been ineffective in controlling her disease.  She is ready and willing to proceed with definitive surgery.  On 12/19/2014, patient underwent surgery for tubal pregnancy.  Findings at that time included 16 week size globular uterus, which extended to the pelvic sidewalls.  Intraoperative determination at that time was that the patient should have an open hysterectomy due to anatomic limitations identified at laparoscopy.  TRANSVAGINAL ULTRASOUND: IMPRESSION: Large mass at the anterior fundus compatible with a large leiomyoma up to 6.5 cm in size, extending submucosal.  Nonvisualization RIGHT ovary.  Unremarkable LEFT ovary.   Pertinent Gynecological History: Patient's last menstrual period was 02/11/2016 (exact date). Contraception: Depo-Provera injections and tubal ligation Last Pap: 2014. Results were: normal Menorrhagia with normal cycle Interval: 28 days Duration: 5-6 days History of 4 unit RBC transfusion  Discussed Blood/Blood Products: yes   Menstrual History: OB History    Gravida Para Term Preterm AB TAB SAB Ectopic Multiple Living   4 3 2 1 1   1 1 2       Menarche age: NA  Patient's last menstrual period was 03/15/2016.    Past Medical History  Diagnosis Date  . Anemia   . Fibroid uterus   . S/P tubal ligation   . Heavy periods   . Pelvic peritoneal adhesions, female   . S/P ectopic pregnancy 12/19/2014    Left tubal pregnancy, excised.  . Asthma     Past Surgical History  Procedure Laterality Date  . Tubal ligation    . Bilateral salpingectomy  12/2014    s/p left ectopic pregnancy    OB History  Gravida Para Term Preterm AB SAB TAB Ectopic Multiple  Living  4 3 2 1 1   1 1 2     # Outcome Date GA Lbr Len/2nd Weight Sex Delivery Anes PTL Lv  4 Ectopic 2016          3 Term 2013   5 lb 1.9 oz (2.322 kg) F Vag-Spont   Y  2 Term 2006   5 lb 1.8 oz (2.318 kg) F Vag-Spont   Y  1A Preterm 2005    M Vag-Spont   FD  1B Preterm 2005    M    FD      Social History   Social History  . Marital Status: Single    Spouse Name: N/A  . Number of Children: N/A  . Years of Education: N/A   Social History Main Topics  . Smoking status: Never Smoker   . Smokeless tobacco: None  . Alcohol Use: Yes     Comment: occ.  . Drug Use: No  . Sexual Activity: Yes    Birth Control/ Protection: None   Other Topics Concern  . None   Social History Narrative    Family History  Problem Relation Age of Onset  . Liver cancer Mother   . Diabetes Paternal Grandmother   . Heart disease Neg Hx      (Not in a hospital admission)  Allergies  Allergen Reactions  . Progestins Hives    Review of Systems Constitutional: No recent fever/chills/sweats Respiratory: No recent cough/bronchitis Cardiovascular: No chest pain Gastrointestinal: No recent nausea/vomiting/diarrhea Genitourinary: No UTI symptoms Hematologic/lymphatic:No history  of coagulopathy or recent blood thinner use    Objective:    BP 138/82 mmHg  Pulse 69  Ht 5\' 8"  (1.727 m)  Wt 274 lb 12.8 oz (124.648 kg)  BMI 41.79 kg/m2  LMP 03/15/2016  General:   Normal  Skin:   normal  HEENT:  Normal  Neck:  Supple without Adenopathy or Thyromegaly  Lungs:   Heart:              Breasts:   Abdomen:  Pelvis:  M/S   Extremeties:  Neuro:    clear to auscultation bilaterally   Normal without murmur   Not Examined   soft, non-tender; bowel sounds normal; no masses,  no organomegaly   Exam deferred to OR  No CVAT  Warm/Dry   Normal       02/18/2016 PELVIC: Anthropoid External Genitalia: Normal BUS: Normal Vagina: Normal, abundent menstural  blood in vaginal vault Cervix: No cervical motion tenderness Uterus:mobile, globular,16 week size  Adnexa: not palpable RV: not examined Bladder: Nontender  Assessment:    1.  16 week fibroid uterus. 2.  Chronic anemia secondary to menorrhagia   Plan:  Total abdominal hysterectomy, bilateral salpingectomy  Preop counseling: On 04/04/2016.  Patient is to undergo TAH, bilateral salpingectomy for  Symptomatic fibroid uterus.She is understanding of the planned procedure and is aware of and is accepting of all surgical risks which include but are not limited to bleeding, infection, pelvic organs with need for repair, blood clot disorders, anesthesia risks, etc.  All questions have been answered.  Informed consent is given.  Patient is ready and willing to proceed with surgery as scheduled.  Brayton Mars, MD  Note: This dictation was prepared with Dragon dictation along with smaller phrase technology. Any transcriptional errors that result from this process are unintentional.

## 2016-04-04 NOTE — Interval H&P Note (Signed)
History and Physical Interval Note:  04/04/2016 10:16 AM  Anita [name]  has presented today for surgery, with the diagnosis of MENORRHAGIA, ANEMIA, UTERINE LEIOMYOMA  The various methods of treatment have been discussed with the patient and family. After consideration of risks, benefits and other options for treatment, the patient has consented to  Procedure(s): HYSTERECTOMY ABDOMINAL WITH BILATERAL SALPINGECTOMY (Bilateral) as a surgical intervention .  The patient's history has been reviewed, patient examined, no change in status, stable for surgery.  I have reviewed the patient's chart and labs.  Questions were answered to the patient's satisfaction.     Hassell Done A Newell Frater

## 2016-04-04 NOTE — Op Note (Signed)
OPERATIVE NOTE:  Anita [name] PROCEDURE DATE: 04/04/2016   PREOPERATIVE DIAGNOSIS:  1. Menorrhagia to anemia 2. Fibroid uterus-16 weeks size  POSTOPERATIVE DIAGNOSIS:  1. Menorrhagia to anemia 2. Fibroid uterus-16 weeks size 3. Endometrioma-left ovary  PROCEDURE: TAH left oophorectomy SURGEON:  Brayton Mars, MD ASSISTANTS: Dr. Marcelline Mates and PA-S Paige Henlawson: General INDICATIONS: 30 y.o. [id number] , history of menorrhagia to anemia and 16 week size fibroid uterus, history of tubal pregnancy, status post-laparoscopic bilateral salpingectomy, presents now for definitive surgical management. Patient has history of multiple blood transfusions for menorrhagia.  FINDINGS:   1. 16 weeks size fibroid uterus 2. Endometrioma left ovary 3. Normal right ovary   I/O's: Total I/O In: 1400 [I.V.:1400] Out: 650 [Urine:300; Blood:350] COUNTS:  YES SPECIMENS: Uterus with cervix; left ovary ANTIBIOTIC PROPHYLAXIS:Ancef 3 g COMPLICATIONS: None immediate  PROCEDURE IN DETAIL:  Patient was brought to the operating room and she was placed in the supine position. General endotracheal anesthesia was induced without difficulty. A ChloraPrep abdominal and Betadine intravaginal prep and drape was performed in standard fashion. Foley catheter was placed in was draining clear yellow urine. Timeout was completed. Midline laparotomy was skin, tissues down the fascia. Fascia was sized and peritoneum was entered. A self-retaining retractor was placed to facilitate the procedure. The above-noted findings were made and decision was made to remove the uterus with cervix and left ovary. The LigaSure instrument was used suture and clamps to complete the procedure. Round ligaments were clamped coagulated and cut. The utero-ovarian ligaments were clamped coagulated and cut. The cardinal broad ligament complexes were likewise clamped coagulated bilaterally down to the level of the uterosacral  ligament. The uterus was amputated with the scalpel from the cervix in order to facilitate ease of completion of procedure. Cervix was grasped with double-tooth tenaculum and the remainder cardinal and broad ligament complexes were taken down with the LigaSure instrument as well as curved Heaney clamps. Angles of the vagina were crossclamped and the cervix was excised from the operative field. Angles of the vagina were closed using a Richardson stitch of 0 Vicryl suture. The cuff was closed with figure-of-eight suture to optimize hemostasis. The pelvis was irrigated and irrigant fluid was aspirated. Right ovary appeared normal. Left ovary demonstrated 2 chocolate cysts which were opened. Decision was made to remove the left ovary the left infundibulopelvic ligament was crossclamped and excised using the LigaSure. Several small bleeders along the vaginal cuff were  ligated using 2-0 Vicryl suture. The pelvis was copiously irrigated. Abdomen was closed in layers using 0 Maxon in a simple running manner. This was followed by 2-0 Vicryl suture is tissues. The skin was closed with staples. Lidoderm patch was applied. Dressing was placed. Patient was then awakened mobilized and taken to re3covery in satisfactory condition  Matha Masse A. Zipporah Plants, MD, ACOG [facility]

## 2016-04-04 NOTE — Anesthesia Preprocedure Evaluation (Signed)
Anesthesia Evaluation  Patient identified by MRN, date of birth, ID band Patient awake    Reviewed: Allergy & Precautions, NPO status , Patient's Chart, lab work & pertinent test results  History of Anesthesia Complications Negative for: history of anesthetic complications  Airway Mallampati: III       Dental   Pulmonary neg pulmonary ROS, asthma ,           Cardiovascular negative cardio ROS       Neuro/Psych negative neurological ROS     GI/Hepatic negative GI ROS, Neg liver ROS, GERD  ,  Endo/Other  negative endocrine ROS  Renal/GU negative Renal ROS     Musculoskeletal   Abdominal   Peds  Hematology  (+) anemia ,   Anesthesia Other Findings   Reproductive/Obstetrics                             Anesthesia Physical Anesthesia Plan  ASA: II  Anesthesia Plan: General   Post-op Pain Management:    Induction: Intravenous  Airway Management Planned: Oral ETT  Additional Equipment:   Intra-op Plan:   Post-operative Plan:   Informed Consent: I have reviewed the patients History and Physical, chart, labs and discussed the procedure including the risks, benefits and alternatives for the proposed anesthesia with the patient or authorized representative who has indicated his/her understanding and acceptance.     Plan Discussed with:   Anesthesia Plan Comments:         Anesthesia Quick Evaluation

## 2016-04-04 NOTE — Anesthesia Procedure Notes (Signed)
Procedure Name: Intubation Date/Time: 04/04/2016 10:45 AM Performed by: Allean Found Pre-anesthesia Checklist: Patient identified, Emergency Drugs available, Patient being monitored, Suction available and Timeout performed Patient Re-evaluated:Patient Re-evaluated prior to inductionOxygen Delivery Method: Circle system utilized Preoxygenation: Pre-oxygenation with 100% oxygen Intubation Type: IV induction Ventilation: Mask ventilation without difficulty Laryngoscope Size: Mac and 3 Grade View: Grade II Tube type: Oral Tube size: 7.0 mm Number of attempts: 2 Airway Equipment and Method: Stylet Placement Confirmation: ETT inserted through vocal cords under direct vision,  positive ETCO2 and breath sounds checked- equal and bilateral Secured at: 21 cm Tube secured with: Tape Dental Injury: Teeth and Oropharynx as per pre-operative assessment  Difficulty Due To: Difficulty was unanticipated Comments: Large tongue, difficult to sweep, poor lower dentition

## 2016-04-04 NOTE — Transfer of Care (Signed)
Immediate Anesthesia Transfer of Care Note  Patient: Anita [name]  Procedure(s) Performed: Procedure(s): HYSTERECTOMY ABDOMINAL WITH BILATERAL SALPINGECTOMY (Bilateral)  Patient Location: PACU  Anesthesia Type:General  Level of Consciousness: sedated  Airway & Oxygen Therapy: Patient Spontanous Breathing and Patient connected to face mask oxygen  Post-op Assessment: Report given to RN and Post -op Vital signs reviewed and stable  Post vital signs: Reviewed and stable  Last Vitals:  Filed Vitals:   04/04/16 0950 04/04/16 1301  BP: 140/77 130/71  Pulse: 68 73  Temp: 37.1 C 36.1 C  Resp: 16 11    Last Pain:  Filed Vitals:   04/04/16 1303  PainSc: Asleep         Complications: No apparent anesthesia complications

## 2016-04-04 NOTE — Anesthesia Postprocedure Evaluation (Signed)
Anesthesia Post Note  Patient: Anita [name]  Procedure(s) Performed: Procedure(s) (LRB): HYSTERECTOMY ABDOMINAL  (Bilateral) OOPHORECTOMY (Left)  Patient location during evaluation: PACU Anesthesia Type: General Level of consciousness: awake and alert Pain management: pain level controlled Vital Signs Assessment: post-procedure vital signs reviewed and stable Respiratory status: spontaneous breathing and respiratory function stable Cardiovascular status: stable Anesthetic complications: no    Last Vitals:  Filed Vitals:   04/04/16 1401 04/04/16 1432  BP:  121/66  Pulse: 71 69  Temp: 36.2 C 36.9 C  Resp: 14 16    Last Pain:  Filed Vitals:   04/04/16 1453  PainSc: 10-Worst pain ever                 KEPHART,WILLIAM K

## 2016-04-05 LAB — SURGICAL PATHOLOGY

## 2016-04-05 LAB — HEMOGLOBIN: HEMOGLOBIN: 9.6 g/dL — AB (ref 12.0–16.0)

## 2016-04-05 NOTE — Progress Notes (Signed)
1 Day Post-Op Procedure(s) (LRB): HYSTERECTOMY ABDOMINAL  (Bilateral) OOPHORECTOMY (Left)  Subjective: Patient reports tolerating PO, + flatus and no problems voiding.    Objective: I have reviewed patient's vital signs, intake and output, medications and labs.  General: alert, cooperative, no distress and moderately obese Resp: clear to auscultation bilaterally Cardio: regular rate and rhythm, S1, S2 normal, no murmur, click, rub or gallop GI: soft, non-tender; bowel sounds normal; no masses,  no organomegaly and incision: clean, dry, intact and Dressing removed Extremities: extremities normal, atraumatic, no cyanosis or edema and Homans sign is negative, no sign of DVT Vaginal Bleeding: minimal  Assessment: s/p Procedure(s): HYSTERECTOMY ABDOMINAL  (Bilateral) OOPHORECTOMY (Left): stable, progressing well and tolerating diet  Plan: Advance diet Encourage ambulation Advance to PO medication Discontinue IV fluids  LOS: 1 day    Anita [name] 04/05/2016, 1:43 PM

## 2016-04-06 DIAGNOSIS — Z90721 Acquired absence of ovaries, unilateral: Secondary | ICD-10-CM

## 2016-04-06 DIAGNOSIS — Z90710 Acquired absence of both cervix and uterus: Secondary | ICD-10-CM

## 2016-04-06 MED ORDER — IBUPROFEN 800 MG PO TABS: 800.0000 mg | ORAL_TABLET | Freq: Three times a day (TID) | ORAL | Status: DC

## 2016-04-06 MED ORDER — OXYCODONE-ACETAMINOPHEN 5-325 MG PO TABS: 1.0000 | ORAL_TABLET | ORAL | Status: DC | PRN

## 2016-04-06 NOTE — Discharge Summary (Signed)
Physician Discharge Summary  Patient ID: CALIYA ROGUS MRN: [id number] DOB/AGE: July 18, 1986 30 y.o.  Admit date: 04/04/2016 Discharge date: 04/06/2016  Admission Diagnoses: Menorrhagia to Anemia and Fibroids  Discharge Diagnoses:  Menorrhagia to Anemia, Endometriosis and Fibroids  Operative Procedures: Procedure(s): HYSTERECTOMY ABDOMINAL  (Bilateral) OOPHORECTOMY (Left)  Hospital Course: Uncomplicated.   Significant Diagnostic Studies:  Lab Results  Component Value Date   HGB 9.6* 04/05/2016   HGB 11.7* 03/22/2016   HGB 8.1* 01/28/2016   Lab Results  Component Value Date   HCT 35.2 03/22/2016   HCT 37.2 02/19/2016   HCT 26.3* 01/28/2016   CBC Latest Ref Rng 04/05/2016 03/22/2016 02/19/2016  WBC 3.6 - 11.0 K/uL - 4.8 6.2  Hemoglobin 12.0 - 16.0 g/dL 9.6(L) 11.7(L) -  Hematocrit 35.0 - 47.0 % - 35.2 37.2  Platelets 150 - 440 K/uL - 298 632(H)     Discharged Condition: good  Discharge Exam: Blood pressure 129/78, pulse 70, temperature 98.6 F (37 C), temperature source Oral, resp. rate 20, height 5\' 8"  (1.727 m), weight 274 lb (124.286 kg), SpO2 100 %. Incision/Wound: clean and dry  Disposition: 01-Home or Self Care  Discharge Instructions    Discharge patient    Complete by:  As directed             Medication List    TAKE these medications        docusate sodium 100 MG capsule  Commonly known as:  COLACE  Take 1 capsule (100 mg total) by mouth 2 (two) times daily as needed for mild constipation.     ferrous sulfate 325 (65 FE) MG tablet  Commonly known as:  FERROUSUL  Take 1 tablet (325 mg total) by mouth 3 (three) times daily with meals.     ibuprofen 800 MG tablet  Commonly known as:  ADVIL,MOTRIN  Take 1 tablet (800 mg total) by mouth 3 (three) times daily.     oxyCODONE-acetaminophen 5-325 MG tablet  Commonly known as:  PERCOCET/ROXICET  Take 1-2 tablets by mouth every 4 (four) hours as needed (moderate to severe pain (when tolerating fluids)).            Follow-up Information    Follow up with Brayton Mars, MD. Go in 1 week.   Specialties:  Obstetrics and Gynecology, Radiology   Why:  Post Op Check and staple removal   Contact information:   [address] [place] [id number] [phone]       Signed: Alanda Slim Francis Doenges 04/06/2016, 1:53 PM

## 2016-04-06 NOTE — Progress Notes (Signed)
Patient discharge to home via wheelchair with significant other and children.  Discharge instructions reviewed with patient.  All questions answered.  Follow up appointment scheduled.

## 2016-04-12 ENCOUNTER — Encounter: Payer: Self-pay | Admitting: Obstetrics and Gynecology

## 2016-04-12 ENCOUNTER — Ambulatory Visit (INDEPENDENT_AMBULATORY_CARE_PROVIDER_SITE_OTHER): Payer: Medicaid Other | Admitting: Obstetrics and Gynecology

## 2016-04-12 VITALS — BP 137/86 | HR 80 | Ht 68.0 in | Wt 273.5 lb

## 2016-04-12 DIAGNOSIS — Z90721 Acquired absence of ovaries, unilateral: Secondary | ICD-10-CM

## 2016-04-12 DIAGNOSIS — Z09 Encounter for follow-up examination after completed treatment for conditions other than malignant neoplasm: Secondary | ICD-10-CM

## 2016-04-12 DIAGNOSIS — D259 Leiomyoma of uterus, unspecified: Secondary | ICD-10-CM

## 2016-04-12 DIAGNOSIS — N801 Endometriosis of ovary: Secondary | ICD-10-CM

## 2016-04-12 DIAGNOSIS — Z90710 Acquired absence of both cervix and uterus: Secondary | ICD-10-CM

## 2016-04-12 DIAGNOSIS — N80109 Endometriosis of ovary, unspecified side, unspecified depth: Secondary | ICD-10-CM

## 2016-04-12 NOTE — Patient Instructions (Signed)
1. Return in 5 weeks for final postop check 2. Take ibuprofen around-the-clock and Tylenol around-the-clock to avoid pain exacerbation for the next week 3. Gradually increase activities

## 2016-04-12 NOTE — Progress Notes (Signed)
Subjective:  Patient is a 30 yo [id number] 1 week s/p TAH, bilateral salpingectomy, and left oophorectomy. She reports mild 5/10 pain in her abdomen. Admits to some constipation but has taken stool softeners for relief. Denies fever and drainage from incision site.   Review of Systems  Constitutional: Negative for fever.  Gastrointestinal: Positive for abdominal pain and constipation. Negative for nausea and vomiting.  Genitourinary: Negative for dysuria, urgency, frequency and hematuria.  Skin: Negative for rash.    Objective: BP 137/86 mmHg  Pulse 80  Ht 5\' 8"  (1.727 m)  Wt 273 lb 8 oz (124.059 kg)  BMI 41.60 kg/m2  LMP 03/15/2016  [facility] women s/p TAH, bilateral salpingectomy, and left oophorectomy. Incision is well approximated, slightly uneven, no drainage.  Assessment: 1. Normal post-op check 1 week out 2. Staples removed and steri strips placed on incision  Plan: 1. Return in 5 weeks for final post-op check 2. Tylenol and Motrin for pain 3. Advance activities as tolerated 4. Continue stool softeners as needed for constipation  Arlyss Gandy, PA-S Brayton Mars, MD   I have seen, interviewed, and examined the patient in conjunction with the [facility].A. student and affirm the diagnosis and management plan. Teaghan Melrose A. Kainan Patty, MD, FACOG   Note: This dictation was prepared with Dragon dictation along with smaller phrase technology. Any transcriptional errors that result from this process are unintentional.

## 2016-04-26 ENCOUNTER — Encounter: Payer: Self-pay | Admitting: Obstetrics and Gynecology

## 2016-05-10 ENCOUNTER — Ambulatory Visit (INDEPENDENT_AMBULATORY_CARE_PROVIDER_SITE_OTHER): Payer: Medicaid Other | Admitting: Obstetrics and Gynecology

## 2016-05-10 ENCOUNTER — Encounter: Payer: Self-pay | Admitting: Obstetrics and Gynecology

## 2016-05-10 VITALS — BP 147/91 | HR 67 | Ht 68.0 in | Wt 284.4 lb

## 2016-05-10 DIAGNOSIS — R030 Elevated blood-pressure reading, without diagnosis of hypertension: Secondary | ICD-10-CM

## 2016-05-10 DIAGNOSIS — N801 Endometriosis of ovary: Secondary | ICD-10-CM

## 2016-05-10 DIAGNOSIS — Z90710 Acquired absence of both cervix and uterus: Secondary | ICD-10-CM

## 2016-05-10 DIAGNOSIS — IMO0001 Reserved for inherently not codable concepts without codable children: Secondary | ICD-10-CM | POA: Insufficient documentation

## 2016-05-10 DIAGNOSIS — N80109 Endometriosis of ovary, unspecified side, unspecified depth: Secondary | ICD-10-CM

## 2016-05-10 DIAGNOSIS — R638 Other symptoms and signs concerning food and fluid intake: Secondary | ICD-10-CM

## 2016-05-10 DIAGNOSIS — Z09 Encounter for follow-up examination after completed treatment for conditions other than malignant neoplasm: Secondary | ICD-10-CM

## 2016-05-10 DIAGNOSIS — D259 Leiomyoma of uterus, unspecified: Secondary | ICD-10-CM

## 2016-05-10 DIAGNOSIS — Z90721 Acquired absence of ovaries, unilateral: Secondary | ICD-10-CM

## 2016-05-10 NOTE — Patient Instructions (Signed)
1. Resume activities as tolerated 2. Return to primary care office for routine exams and follow-up on blood pressure

## 2016-05-10 NOTE — Progress Notes (Signed)
She; 1. Final postop check 2. Status post TAH left oophorectomy  Patient is doing well with normal bowel and bladder function. She is not experiencing any significant pelvic pain. She does not note any vaginal discharge or spotting.  PATHOLOGY: 687 grams  DIAGNOSIS:  A. UTERUS WITH CERVIX AND LEFT OVARY; HYSTERECTOMY WITH LEFT  OOPHORECTOMY:  - NABOTHIAN CYSTS OF THE CERVIX.  - SECRETORY ENDOMETRIUM.  - LEIOMYOMATA, UP TO 7.5 CM; WITHOUT ATYPIA, COAGULATIVE NECROSIS OR  INCREASED MITOSES.  - ENDOMETRIOMA AND HEMORRHAGIC FOLLICULAR CYST OF OVARY.   OBJECTIVE: BP (!) 147/91   Pulse 67   Ht 5\' 8"  (1.727 m)   Wt 284 lb 6.4 oz (129 kg)   LMP 03/15/2016 Comment: upreg neg  BMI 43.24 kg/m  Pleasant female in no acute distress Abdomen: Soft, nontender; midline incision is well-healed Pelvic: External genitalia normal BUS-normal Vagina normal Cervix-surgically absent Uterus -surgically absent Adnexa-nonpalpable nontender Rectovaginal-normal external exam  ASSESSMENT: 1. Normal 6 week postop check status post TAH left oophorectomy for uterine fibroids and endometriosis 2. Elevated blood pressure   PLAN: 1. Resume all activities without restriction 2. Return to primary care office for follow-up on blood pressure  Brayton Mars, MD  Note: This dictation was prepared with Dragon dictation along with smaller phrase technology. Any transcriptional errors that result from this process are unintentional.

## 2016-07-24 ENCOUNTER — Emergency Department
Admission: EM | Admit: 2016-07-24 | Discharge: 2016-07-24 | Disposition: A | Discharge status: Home / Self Care | Payer: Medicaid Other | Attending: Emergency Medicine | Admitting: Emergency Medicine

## 2016-07-24 ENCOUNTER — Encounter: Payer: Self-pay | Admitting: Emergency Medicine

## 2016-07-24 DIAGNOSIS — J45909 Unspecified asthma, uncomplicated: Secondary | ICD-10-CM | POA: Insufficient documentation

## 2016-07-24 DIAGNOSIS — S025XXA Fracture of tooth (traumatic), initial encounter for closed fracture: Secondary | ICD-10-CM

## 2016-07-24 DIAGNOSIS — R220 Localized swelling, mass and lump, head: Secondary | ICD-10-CM

## 2016-07-24 DIAGNOSIS — K0381 Cracked tooth: Secondary | ICD-10-CM | POA: Diagnosis not present

## 2016-07-24 DIAGNOSIS — K0889 Other specified disorders of teeth and supporting structures: Secondary | ICD-10-CM | POA: Diagnosis present

## 2016-07-24 MED ORDER — OXYCODONE-ACETAMINOPHEN 5-325 MG PO TABS
1.0000 | ORAL_TABLET | Freq: Once | ORAL | Status: AC
  Administered 2016-07-24: 1 via ORAL
  Filled 2016-07-24: qty 1

## 2016-07-24 MED ORDER — AMOXICILLIN 500 MG PO CAPS: 500.0000 mg | ORAL_CAPSULE | Freq: Three times a day (TID) | ORAL | 0 refills | Status: DC

## 2016-07-24 MED ORDER — AMOXICILLIN 500 MG PO CAPS
500.0000 mg | ORAL_CAPSULE | Freq: Once | ORAL | Status: AC
  Administered 2016-07-24: 500 mg via ORAL
  Filled 2016-07-24: qty 1

## 2016-07-24 MED ORDER — LIDOCAINE VISCOUS 2 % MT SOLN
15.0000 mL | Freq: Once | OROMUCOSAL | Status: AC
  Administered 2016-07-24: 15 mL via OROMUCOSAL
  Filled 2016-07-24: qty 15

## 2016-07-24 MED ORDER — OXYCODONE-ACETAMINOPHEN 5-325 MG PO TABS: 1.0000 | ORAL_TABLET | ORAL | 0 refills | Status: DC | PRN

## 2016-07-24 MED ORDER — IBUPROFEN 800 MG PO TABS: 800.0000 mg | ORAL_TABLET | Freq: Three times a day (TID) | ORAL | 0 refills | Status: DC | PRN

## 2016-07-24 MED ORDER — IBUPROFEN 800 MG PO TABS
800.0000 mg | ORAL_TABLET | Freq: Once | ORAL | Status: AC
  Administered 2016-07-24: 800 mg via ORAL
  Filled 2016-07-24: qty 1

## 2016-07-24 NOTE — ED Provider Notes (Signed)
[facility] Emergency Department Provider Note   ____________________________________________   First MD Initiated Contact with Patient 07/24/16 [phone]     (approximate)  I have reviewed the triage vital signs and the nursing notes.   HISTORY  Chief Complaint Dental Pain and Facial Swelling    HPI Anita [name] is a 30 y.o. female who presents to the ED from home with a chief complaint of dental pain and swelling. Reports eating and felt like she cracked her right upper molar yesterday. Progressive swelling to her right cheek throughout the day and worse this morning. Denies associated fever, chills, chest pain, shortness of breath, abdominal pain, nausea, vomiting, diarrhea. Denies recent travel or trauma. Nothing makes her symptoms better or worse.   Past Medical History:  Diagnosis Date  . Anemia   . Asthma   . Fibroid uterus   . Heavy periods   . Pelvic peritoneal adhesions, female   . S/P ectopic pregnancy 12/19/2014   Left tubal pregnancy, excised.  . S/P tubal ligation     Patient Active Problem List   Diagnosis Date Noted  . Elevated blood pressure 05/10/2016  . Increased BMI 05/10/2016  . Status post hysterectomy with Lt oophorectomy 04/06/2016  . Endometriosis of ovary 04/04/2016  . Pelvic peritoneal adhesions, female 05/05/2015  . Status post ectopic pregnancy 05/05/2015    Past Surgical History:  Procedure Laterality Date  . BILATERAL SALPINGECTOMY  12/2014   s/p left ectopic pregnancy  . HYSTERECTOMY ABDOMINAL WITH SALPINGECTOMY Bilateral 04/04/2016   Procedure: HYSTERECTOMY ABDOMINAL ;  Surgeon: Brayton Mars, MD;  Location: [facility] ORS;  Service: Gynecology;  Laterality: Bilateral;  . OOPHORECTOMY Left 04/04/2016   Procedure: OOPHORECTOMY;  Surgeon: Brayton Mars, MD;  Location: [facility] ORS;  Service: Gynecology;  Laterality: Left;  . TUBAL LIGATION      Prior to Admission medications   Medication Sig Start Date  End Date Taking? Authorizing Provider  amoxicillin (AMOXIL) 500 MG capsule Take 1 capsule (500 mg total) by mouth 3 (three) times daily. 07/24/16   Paulette Blanch, MD  ibuprofen (ADVIL,MOTRIN) 800 MG tablet Take 1 tablet (800 mg total) by mouth every 8 (eight) hours as needed for moderate pain. 07/24/16   Paulette Blanch, MD  oxyCODONE-acetaminophen (ROXICET) 5-325 MG tablet Take 1 tablet by mouth every 4 (four) hours as needed for severe pain. 07/24/16   Paulette Blanch, MD    Allergies Progestins  Family History  Problem Relation Age of Onset  . Liver cancer Mother   . Diabetes Paternal Grandmother   . Heart disease Neg Hx     Social History Social History  Substance Use Topics  . Smoking status: Never Smoker  . Smokeless tobacco: Never Used  . Alcohol use Yes     Comment: occ.    Review of Systems  Constitutional: No fever/chills. Eyes: No visual changes. ENT: Positive for dental pain and swelling. No sore throat. Cardiovascular: Denies chest pain. Respiratory: Denies shortness of breath. Gastrointestinal: No abdominal pain.  No nausea, no vomiting.  No diarrhea.  No constipation. Genitourinary: Negative for dysuria. Musculoskeletal: Negative for back pain. Skin: Negative for rash. Neurological: Negative for headaches, focal weakness or numbness.  10-point ROS otherwise negative.  ____________________________________________   PHYSICAL EXAM:  VITAL SIGNS: ED Triage Vitals  Enc Vitals Group     BP 07/24/16 0059 134/80     Pulse Rate 07/24/16 0059 78     Resp 07/24/16 0059 18  Temp 07/24/16 0059 97.9 F (36.6 C)     Temp Source 07/24/16 0059 Oral     SpO2 07/24/16 0059 99 %     Weight 07/24/16 0100 287 lb (130.2 kg)     Height 07/24/16 0100 5\' 9"  (1.753 m)     Head Circumference --      Peak Flow --      Pain Score 07/24/16 0100 10     Pain Loc --      Pain Edu? --      Excl. in [place]? --     Constitutional: Alert and oriented. Well appearing and in mild acute  distress. Eyes: Conjunctivae are normal. PERRL. EOMI. Head: Atraumatic. Right cheek swelling. Nose: No congestion/rhinnorhea. Mouth/Throat: Right upper molar cracked. Tender to palpation with tongue blade. Mucous membranes are moist.  Oropharynx non-erythematous. Neck: No stridor.   Cardiovascular: Normal rate, regular rhythm. Grossly normal heart sounds.  Good peripheral circulation. Respiratory: Normal respiratory effort.  No retractions. Lungs CTAB. Gastrointestinal: Soft and nontender. No distention. No abdominal bruits. No CVA tenderness. Musculoskeletal: No lower extremity tenderness nor edema.  No joint effusions. Neurologic:  Normal speech and language. No gross focal neurologic deficits are appreciated. No gait instability. Skin:  Skin is warm, dry and intact. No rash noted. Psychiatric: Mood and affect are normal. Speech and behavior are normal.  ____________________________________________   LABS (all labs ordered are listed, but only abnormal results are displayed)  Labs Reviewed - No data to display ____________________________________________  EKG  None ____________________________________________  RADIOLOGY  None ____________________________________________   PROCEDURES  Procedure(s) performed: None  Procedures  Critical Care performed: No  ____________________________________________   INITIAL IMPRESSION / ASSESSMENT AND PLAN / ED COURSE  Pertinent labs & imaging results that were available during my care of the patient were reviewed by me and considered in my medical decision making (see chart for details).  30 year old female who presents with dentalgia secondary to cracked tooth. Will place on amoxicillin, analgesia and dental follow-up. Strict return precautions given. Patient verbalizes understanding and agrees with plan of care.  Clinical Course     ____________________________________________   FINAL CLINICAL IMPRESSION(S) / ED  DIAGNOSES  Final diagnoses:  Facial swelling  Dentalgia  Closed fracture of tooth, initial encounter      NEW MEDICATIONS STARTED DURING THIS VISIT:  New Prescriptions   AMOXICILLIN (AMOXIL) 500 MG CAPSULE    Take 1 capsule (500 mg total) by mouth 3 (three) times daily.   IBUPROFEN (ADVIL,MOTRIN) 800 MG TABLET    Take 1 tablet (800 mg total) by mouth every 8 (eight) hours as needed for moderate pain.   OXYCODONE-ACETAMINOPHEN (ROXICET) 5-325 MG TABLET    Take 1 tablet by mouth every 4 (four) hours as needed for severe pain.     Note:  This document was prepared using Dragon voice recognition software and may include unintentional dictation errors.    Paulette Blanch, MD 07/24/16 [phone]

## 2016-07-24 NOTE — Discharge Instructions (Signed)
1. Take antibiotic as prescribed (amoxicillin 500 mg 3 times daily 7 days). 2. You may take pain medicines as needed (Motrin/Percocet #15). 3. Return to the ER for worsening symptoms, persistent vomiting, difficulty breathing or other concerns.

## 2016-07-24 NOTE — ED Triage Notes (Signed)
Patient with complaint of dental pain and swelling to her left cheek that started yesterday. Patient reports that the pain and swelling have become worse through out the day.

## 2016-07-24 NOTE — ED Notes (Addendum)
Pt c/o right sided facial swelling. Pt is in NAD at this time.

## 2017-12-04 ENCOUNTER — Encounter: Payer: Self-pay | Admitting: *Deleted

## 2017-12-04 ENCOUNTER — Other Ambulatory Visit: Payer: Self-pay

## 2017-12-04 DIAGNOSIS — L03116 Cellulitis of left lower limb: Secondary | ICD-10-CM | POA: Insufficient documentation

## 2017-12-04 DIAGNOSIS — M79652 Pain in left thigh: Secondary | ICD-10-CM | POA: Diagnosis present

## 2017-12-04 DIAGNOSIS — Z79899 Other long term (current) drug therapy: Secondary | ICD-10-CM | POA: Diagnosis not present

## 2017-12-04 DIAGNOSIS — J45909 Unspecified asthma, uncomplicated: Secondary | ICD-10-CM | POA: Diagnosis not present

## 2017-12-04 NOTE — ED Triage Notes (Signed)
Pt reports redness to left inner thigh reports swelling increased and pain increased reports used Cortizone OTC pt denies any fever

## 2017-12-05 ENCOUNTER — Emergency Department
Admission: EM | Admit: 2017-12-05 | Discharge: 2017-12-05 | Disposition: A | Discharge status: Home / Self Care | Payer: Commercial Managed Care - PPO | Attending: Emergency Medicine | Admitting: Emergency Medicine

## 2017-12-05 DIAGNOSIS — L03116 Cellulitis of left lower limb: Secondary | ICD-10-CM

## 2017-12-05 DIAGNOSIS — L0291 Cutaneous abscess, unspecified: Secondary | ICD-10-CM

## 2017-12-05 MED ORDER — DOCUSATE SODIUM 100 MG PO CAPS: ORAL_CAPSULE | ORAL | 0 refills | Status: DC

## 2017-12-05 MED ORDER — HYDROCODONE-ACETAMINOPHEN 5-325 MG PO TABS: 1.0000 | ORAL_TABLET | ORAL | 0 refills | Status: DC | PRN

## 2017-12-05 MED ORDER — CEPHALEXIN 500 MG PO CAPS
500.0000 mg | ORAL_CAPSULE | Freq: Once | ORAL | Status: AC
  Administered 2017-12-05: 500 mg via ORAL
  Filled 2017-12-05: qty 1

## 2017-12-05 MED ORDER — IBUPROFEN 600 MG PO TABS
600.0000 mg | ORAL_TABLET | Freq: Once | ORAL | Status: AC
  Administered 2017-12-05: 600 mg via ORAL

## 2017-12-05 MED ORDER — CETIRIZINE HCL 10 MG PO TABS: 10.0000 mg | ORAL_TABLET | Freq: Every day | ORAL | 0 refills | Status: DC

## 2017-12-05 MED ORDER — ACETAMINOPHEN 500 MG PO TABS
1000.0000 mg | ORAL_TABLET | Freq: Once | ORAL | Status: AC
  Administered 2017-12-05: 1000 mg via ORAL

## 2017-12-05 MED ORDER — IBUPROFEN 600 MG PO TABS
ORAL_TABLET | ORAL | Status: AC
  Administered 2017-12-05: 600 mg via ORAL
  Filled 2017-12-05: qty 1

## 2017-12-05 MED ORDER — LORATADINE 10 MG PO TABS
ORAL_TABLET | ORAL | Status: AC
  Administered 2017-12-05: 10 mg via ORAL
  Filled 2017-12-05: qty 1

## 2017-12-05 MED ORDER — DOXYCYCLINE HYCLATE 100 MG PO CAPS: ORAL_CAPSULE | ORAL | 0 refills | Status: DC

## 2017-12-05 MED ORDER — CEPHALEXIN 500 MG PO CAPS: 500.0000 mg | ORAL_CAPSULE | Freq: Four times a day (QID) | ORAL | 0 refills | Status: DC

## 2017-12-05 MED ORDER — DOXYCYCLINE HYCLATE 100 MG PO TABS
100.0000 mg | ORAL_TABLET | Freq: Once | ORAL | Status: AC
  Administered 2017-12-05: 100 mg via ORAL
  Filled 2017-12-05: qty 1

## 2017-12-05 MED ORDER — LORATADINE 10 MG PO TABS
10.0000 mg | ORAL_TABLET | Freq: Every day | ORAL | Status: DC
  Administered 2017-12-05 (×2): 10 mg via ORAL

## 2017-12-05 MED ORDER — ACETAMINOPHEN 500 MG PO TABS
ORAL_TABLET | ORAL | Status: AC
  Administered 2017-12-05: 1000 mg via ORAL
  Filled 2017-12-05: qty 2

## 2017-12-05 NOTE — Discharge Instructions (Signed)
It appears that you have a small either insect bite or a very small ingrown hair which is developed into some surrounding skin infection called cellulitis and possibly a mild allergic reaction as well.  We discussed incision and drainage, which is cutting open the area to allow it to drain, but at this point it does not appear there is anything to drain.  We have prescribed antibiotics as well as some allergy medicine and it is important that you take the full course of antibiotics until they are both gone.  If your area of redness and swelling is getting worse instead of better or if you develop any new symptoms that concern you, please return either to urgent care or to the emergency department for further evaluation.

## 2017-12-05 NOTE — ED Provider Notes (Signed)
[facility] Emergency Department Provider Note  ____________________________________________   First MD Initiated Contact with Patient 12/05/17 0129     (approximate)  I have reviewed the triage vital signs and the nursing notes.   HISTORY  Chief Complaint Abscess    HPI Anita [name] is a 32 y.o. female with medical history as listed below who presents for evaluation of a gradually worsening area of pain and redness on her left inner thigh.  It started about 24 hours ago and is gradually gotten worse.  The area of redness extends out throughout most of her left inner thigh.  There is a small dot in the middle of it where she thinks either she has an ingrown hair or she got a bug bite.  It is not draining anything and is not open.  She denies fever/chills, chest pain, shortness of breath, nausea, vomiting, and abdominal pain.  She has never had abscesses before.  She did not have any penetrating injury.  Nothing in particular makes the symptoms worse except for the pain is made worse by touching the area.  It is warm to the touch as well.  Past Medical History:  Diagnosis Date  . Anemia   . Asthma   . Fibroid uterus   . Heavy periods   . Pelvic peritoneal adhesions, female   . S/P ectopic pregnancy 12/19/2014   Left tubal pregnancy, excised.  . S/P tubal ligation     Patient Active Problem List   Diagnosis Date Noted  . Elevated blood pressure 05/10/2016  . Increased BMI 05/10/2016  . Status post hysterectomy with Lt oophorectomy 04/06/2016  . Endometriosis of ovary 04/04/2016  . Pelvic peritoneal adhesions, female 05/05/2015  . Status post ectopic pregnancy 05/05/2015    Past Surgical History:  Procedure Laterality Date  . BILATERAL SALPINGECTOMY  12/2014   s/p left ectopic pregnancy  . HYSTERECTOMY ABDOMINAL WITH SALPINGECTOMY Bilateral 04/04/2016   Procedure: HYSTERECTOMY ABDOMINAL ;  Surgeon: Brayton Mars, MD;  Location: [facility] ORS;   Service: Gynecology;  Laterality: Bilateral;  . OOPHORECTOMY Left 04/04/2016   Procedure: OOPHORECTOMY;  Surgeon: Brayton Mars, MD;  Location: [facility] ORS;  Service: Gynecology;  Laterality: Left;  . TUBAL LIGATION      Prior to Admission medications   Medication Sig Start Date End Date Taking? Authorizing Provider  amoxicillin (AMOXIL) 500 MG capsule Take 1 capsule (500 mg total) by mouth 3 (three) times daily. 07/24/16   Paulette Blanch, MD  cephALEXin (KEFLEX) 500 MG capsule Take 1 capsule (500 mg total) by mouth 4 (four) times daily. 12/05/17   Hinda Kehr, MD  cetirizine (ZYRTEC) 10 MG tablet Take 1 tablet (10 mg total) by mouth daily. 12/05/17   Hinda Kehr, MD  docusate sodium (COLACE) 100 MG capsule Take 1 tablet once or twice daily as needed for constipation while taking narcotic pain medicine 12/05/17   Hinda Kehr, MD  doxycycline (VIBRAMYCIN) 100 MG capsule Take 1 capsule (100 mg) by mouth twice daily for 10 days. 12/05/17   Hinda Kehr, MD  HYDROcodone-acetaminophen (NORCO/VICODIN) 5-325 MG tablet Take 1-2 tablets by mouth every 4 (four) hours as needed for moderate pain. 12/05/17   Hinda Kehr, MD  ibuprofen (ADVIL,MOTRIN) 800 MG tablet Take 1 tablet (800 mg total) by mouth every 8 (eight) hours as needed for moderate pain. 07/24/16   Paulette Blanch, MD  oxyCODONE-acetaminophen (ROXICET) 5-325 MG tablet Take 1 tablet by mouth every 4 (four) hours as needed  for severe pain. 07/24/16   Paulette Blanch, MD    Allergies Progestins  Family History  Problem Relation Age of Onset  . Liver cancer Mother   . Diabetes Paternal Grandmother   . Heart disease Neg Hx     Social History Social History   Tobacco Use  . Smoking status: Never Smoker  . Smokeless tobacco: Never Used  Substance Use Topics  . Alcohol use: Yes    Comment: occ.  . Drug use: No    Review of Systems Constitutional: No fever/chills Eyes: No visual changes. ENT: No sore throat. Cardiovascular: Denies  chest pain. Respiratory: Denies shortness of breath. Gastrointestinal: No abdominal pain.  No nausea, no vomiting.  No diarrhea.  No constipation. Genitourinary: Negative for dysuria. Musculoskeletal: Negative for neck pain.  Negative for back pain. Integumentary: Redness and pain of the left inner thigh with a small dot in the center. Neurological: Negative for headaches, focal weakness or numbness.   ____________________________________________   PHYSICAL EXAM:  VITAL SIGNS: ED Triage Vitals  Enc Vitals Group     BP 12/04/17 2216 (!) 142/87     Pulse Rate 12/04/17 2216 79     Resp 12/04/17 2216 20     Temp 12/04/17 2216 98.6 F (37 C)     Temp Source 12/04/17 2216 Oral     SpO2 12/04/17 2216 100 %     Weight 12/04/17 2217 121.1 kg (267 lb)     Height 12/04/17 2217 1.727 m (5\' 8" )     Head Circumference --      Peak Flow --      Pain Score 12/04/17 2217 10     Pain Loc --      Pain Edu? --      Excl. in [place]? --     Constitutional: Alert and oriented. Well appearing and in no acute distress. Eyes: Conjunctivae are normal.  Cardiovascular: Normal rate, regular rhythm. Good peripheral circulation.  Respiratory: Normal respiratory effort.  No retractions Gastrointestinal: Soft and nontender. No distention.  Neurologic:  Normal speech and language. No gross focal neurologic deficits are appreciated.  Skin: Stents of area of erythema on the left inner thigh that involves most of the inner thigh.  It is warm to the touch and consistent with either a cellulitis or an allergic reaction or both.  There is a small dot in the center that appears to either be or or an insect bite as the patient described.  There is a very small amount of induration around the "dot", but there is no fluctuance or evidence of a drainable fluid collection. Psychiatric: Mood and affect are normal. Speech and behavior are normal.  ____________________________________________   LABS (all labs ordered are  listed, but only abnormal results are displayed)  Labs Reviewed - No data to display ____________________________________________  EKG  None - EKG not ordered by ED physician ____________________________________________  RADIOLOGY   ED MD interpretation: No imaging indicated  Official radiology report(s): No results found.  ____________________________________________   PROCEDURES  Critical Care performed: No   Procedure(s) performed:   Procedures   ____________________________________________   INITIAL IMPRESSION / ASSESSMENT AND PLAN / ED COURSE  As part of my medical decision making, I reviewed the following data within the [place] notes reviewed and incorporated    The area is not consistent with a cutaneous MRSA abscess.  It appears she is having either a cellulitis spreading from a small folliculitis or insect bite,  or she is having allergic reaction, or both.  She is nontoxic, afebrile, and generally well-appearing except for the pain in the leg, and it is very warm and "angry".  I explained to her that I did not think that incision and drainage would be productive as there is no drainable fluid collection and only a small amount of induration around the center of the area.  I think that empiric treatment with broad-spectrum antibiotics as well as some allergy medicine to help with the inflammation would be most effective.  I chose doxycycline and Keflex for broad coverage including MRSA.  I gave her strict return precautions if her wound is not improving or if the redness continues to expand.  She understands and agrees with the plan.    ____________________________________________  FINAL CLINICAL IMPRESSION(S) / ED DIAGNOSES  Final diagnoses:  Cellulitis of left lower extremity  Abscess     MEDICATIONS GIVEN DURING THIS VISIT:  Medications  loratadine (CLARITIN) tablet 10 mg (10 mg Oral Given 12/05/17 0241)  doxycycline  (VIBRA-TABS) tablet 100 mg (100 mg Oral Given 12/05/17 0205)  cephALEXin (KEFLEX) capsule 500 mg (500 mg Oral Given 12/05/17 0204)  ibuprofen (ADVIL,MOTRIN) tablet 600 mg (600 mg Oral Given 12/05/17 0253)  acetaminophen (TYLENOL) tablet 1,000 mg (1,000 mg Oral Given 12/05/17 0253)     ED Discharge Orders        Ordered    doxycycline (VIBRAMYCIN) 100 MG capsule     12/05/17 0258    cephALEXin (KEFLEX) 500 MG capsule  4 times daily     12/05/17 0258    HYDROcodone-acetaminophen (NORCO/VICODIN) 5-325 MG tablet  Every 4 hours PRN     12/05/17 0258    docusate sodium (COLACE) 100 MG capsule     12/05/17 0258    cetirizine (ZYRTEC) 10 MG tablet  Daily     12/05/17 0258       Note:  This document was prepared using Dragon voice recognition software and may include unintentional dictation errors.    Hinda Kehr, MD 12/05/17 [phone]

## 2018-06-03 ENCOUNTER — Other Ambulatory Visit: Payer: Self-pay

## 2018-06-03 ENCOUNTER — Encounter: Payer: Self-pay | Admitting: Emergency Medicine

## 2018-06-03 ENCOUNTER — Emergency Department
Admission: EM | Admit: 2018-06-03 | Discharge: 2018-06-03 | Disposition: A | Discharge status: Home / Self Care | Payer: Commercial Managed Care - PPO | Attending: Emergency Medicine | Admitting: Emergency Medicine

## 2018-06-03 DIAGNOSIS — Z79899 Other long term (current) drug therapy: Secondary | ICD-10-CM | POA: Diagnosis not present

## 2018-06-03 DIAGNOSIS — N390 Urinary tract infection, site not specified: Secondary | ICD-10-CM | POA: Diagnosis not present

## 2018-06-03 DIAGNOSIS — J45909 Unspecified asthma, uncomplicated: Secondary | ICD-10-CM | POA: Insufficient documentation

## 2018-06-03 DIAGNOSIS — N939 Abnormal uterine and vaginal bleeding, unspecified: Secondary | ICD-10-CM | POA: Diagnosis present

## 2018-06-03 LAB — WET PREP, GENITAL
Clue Cells Wet Prep HPF POC: NONE SEEN
SPERM: NONE SEEN
Trich, Wet Prep: NONE SEEN
WBC, Wet Prep HPF POC: NONE SEEN
Yeast Wet Prep HPF POC: NONE SEEN

## 2018-06-03 LAB — URINALYSIS, COMPLETE (UACMP) WITH MICROSCOPIC
Bilirubin Urine: NEGATIVE
Glucose, UA: NEGATIVE mg/dL
Ketones, ur: NEGATIVE mg/dL
Nitrite: NEGATIVE
Protein, ur: 100 mg/dL — AB
RBC / HPF: >50 RBC/hpf — ABNORMAL HIGH (ref 0–5)
Specific Gravity, Urine: 1.017 (ref 1.005–1.030)
WBC, UA: >50 WBC/hpf — ABNORMAL HIGH (ref 0–5)
pH: 6 (ref 5.0–8.0)

## 2018-06-03 LAB — RAPID HIV SCREEN (HIV 1/2 AB+AG)
HIV 1/2 Antibodies: NONREACTIVE
HIV-1 P24 Antigen - HIV24: NONREACTIVE

## 2018-06-03 LAB — POCT PREGNANCY, URINE: Preg Test, Ur: NEGATIVE

## 2018-06-03 MED ORDER — NITROFURANTOIN MONOHYD MACRO 100 MG PO CAPS: 100.0000 mg | ORAL_CAPSULE | Freq: Two times a day (BID) | ORAL | 0 refills | Status: AC

## 2018-06-03 MED ORDER — PHENAZOPYRIDINE HCL 200 MG PO TABS: 200.0000 mg | ORAL_TABLET | Freq: Three times a day (TID) | ORAL | 0 refills | Status: DC | PRN

## 2018-06-03 MED ORDER — NITROFURANTOIN MONOHYD MACRO 100 MG PO CAPS
100.0000 mg | ORAL_CAPSULE | Freq: Once | ORAL | Status: AC
Start: 2018-06-03 — End: 2018-06-03
  Administered 2018-06-03: 100 mg via ORAL
  Filled 2018-06-03: qty 1

## 2018-06-03 MED ORDER — PHENAZOPYRIDINE HCL 200 MG PO TABS
200.0000 mg | ORAL_TABLET | Freq: Once | ORAL | Status: AC
  Administered 2018-06-03: 200 mg via ORAL
  Filled 2018-06-03: qty 1

## 2018-06-03 NOTE — Discharge Instructions (Addendum)
Please seek medical attention for any high fevers, chest pain, shortness of breath, change in behavior, persistent vomiting, bloody stool or any other new or concerning symptoms.  

## 2018-06-03 NOTE — ED Provider Notes (Signed)
[facility] Emergency Department Provider Note  ____________________________________________   I have reviewed the triage vital signs and the nursing notes.   HISTORY  Chief Complaint Vaginal Bleeding   History limited by: Not Limited   HPI Anita [name] is a 32 y.o. female who presents to the emergency department today because of concern for bleeding and pelvic pain. Symptoms have been present for a couple of weeks. Came in today because the pain got worse. The patient states that she notices the blood on toilet paper after she wipes after urination. She has noticed painful urination. Denies any pain with intercourse. Denies any other abnormal vaginal discharge.   Per medical record review patient has a history of hysterectomy.  Past Medical History:  Diagnosis Date  . Anemia   . Asthma   . Fibroid uterus   . Heavy periods   . Pelvic peritoneal adhesions, female   . S/P ectopic pregnancy 12/19/2014   Left tubal pregnancy, excised.  . S/P tubal ligation     Patient Active Problem List   Diagnosis Date Noted  . Elevated blood pressure 05/10/2016  . Increased BMI 05/10/2016  . Status post hysterectomy with Lt oophorectomy 04/06/2016  . Endometriosis of ovary 04/04/2016  . Pelvic peritoneal adhesions, female 05/05/2015  . Status post ectopic pregnancy 05/05/2015    Past Surgical History:  Procedure Laterality Date  . BILATERAL SALPINGECTOMY  12/2014   s/p left ectopic pregnancy  . HYSTERECTOMY ABDOMINAL WITH SALPINGECTOMY Bilateral 04/04/2016   Procedure: HYSTERECTOMY ABDOMINAL ;  Surgeon: Brayton Mars, MD;  Location: [facility] ORS;  Service: Gynecology;  Laterality: Bilateral;  . OOPHORECTOMY Left 04/04/2016   Procedure: OOPHORECTOMY;  Surgeon: Brayton Mars, MD;  Location: [facility] ORS;  Service: Gynecology;  Laterality: Left;  . TUBAL LIGATION      Prior to Admission medications   Medication Sig Start Date End Date Taking? Authorizing  Provider  amoxicillin (AMOXIL) 500 MG capsule Take 1 capsule (500 mg total) by mouth 3 (three) times daily. 07/24/16   Paulette Blanch, MD  cephALEXin (KEFLEX) 500 MG capsule Take 1 capsule (500 mg total) by mouth 4 (four) times daily. 12/05/17   Hinda Kehr, MD  cetirizine (ZYRTEC) 10 MG tablet Take 1 tablet (10 mg total) by mouth daily. 12/05/17   Hinda Kehr, MD  docusate sodium (COLACE) 100 MG capsule Take 1 tablet once or twice daily as needed for constipation while taking narcotic pain medicine 12/05/17   Hinda Kehr, MD  doxycycline (VIBRAMYCIN) 100 MG capsule Take 1 capsule (100 mg) by mouth twice daily for 10 days. 12/05/17   Hinda Kehr, MD  HYDROcodone-acetaminophen (NORCO/VICODIN) 5-325 MG tablet Take 1-2 tablets by mouth every 4 (four) hours as needed for moderate pain. 12/05/17   Hinda Kehr, MD  ibuprofen (ADVIL,MOTRIN) 800 MG tablet Take 1 tablet (800 mg total) by mouth every 8 (eight) hours as needed for moderate pain. 07/24/16   Paulette Blanch, MD  oxyCODONE-acetaminophen (ROXICET) 5-325 MG tablet Take 1 tablet by mouth every 4 (four) hours as needed for severe pain. 07/24/16   Paulette Blanch, MD    Allergies Progestins  Family History  Problem Relation Age of Onset  . Liver cancer Mother   . Diabetes Paternal Grandmother   . Heart disease Neg Hx     Social History Social History   Tobacco Use  . Smoking status: Never Smoker  . Smokeless tobacco: Never Used  Substance Use Topics  . Alcohol use: Yes  Comment: occ.  . Drug use: No    Review of Systems Constitutional: No fever/chills Eyes: No visual changes. ENT: No sore throat. Cardiovascular: Denies chest pain. Respiratory: Denies shortness of breath. Gastrointestinal: No abdominal pain.  No nausea, no vomiting.  No diarrhea.   Genitourinary: Negative for dysuria. Musculoskeletal: Negative for back pain. Skin: Negative for rash. Neurological: Negative for headaches, focal weakness or  numbness.  ____________________________________________   PHYSICAL EXAM:  VITAL SIGNS: ED Triage Vitals  Enc Vitals Group     BP 06/03/18 1958 (!) 141/79     Pulse Rate 06/03/18 1958 81     Resp 06/03/18 1958 18     Temp 06/03/18 1958 98.5 F (36.9 C)     Temp Source 06/03/18 1958 Oral     SpO2 06/03/18 1958 99 %     Weight 06/03/18 1959 266 lb 12.1 oz (121 kg)     Height 06/03/18 1959 5\' 9"  (1.753 m)     Head Circumference --      Peak Flow --      Pain Score 06/03/18 1959 8   Constitutional: Alert and oriented.  Eyes: Conjunctivae are normal.  ENT      Head: Normocephalic and atraumatic.      Nose: No congestion/rhinnorhea.      Mouth/Throat: Mucous membranes are moist.      Neck: No stridor. Hematological/Lymphatic/Immunilogical: No cervical lymphadenopathy. Cardiovascular: Normal rate, regular rhythm.  No murmurs, rubs, or gallops.  Respiratory: Normal respiratory effort without tachypnea nor retractions. Breath sounds are clear and equal bilaterally. No wheezes/rales/rhonchi. Gastrointestinal: Soft and non tender. No rebound. No guarding.  Genitourinary: No external lesions appreciated. No lacerations appreciated. No blood noted in vaginal vault.  Musculoskeletal: Normal range of motion in all extremities. No lower extremity edema. Neurologic:  Normal speech and language. No gross focal neurologic deficits are appreciated.  Skin:  Skin is warm, dry and intact. No rash noted. Psychiatric: Mood and affect are normal. Speech and behavior are normal. Patient exhibits appropriate insight and judgment.  ____________________________________________    LABS (pertinent positives/negatives)  Wet prep negative UA turbid, large urine dipstick, large leukocytes, >50 rbc and wbc  ____________________________________________   EKG  None  ____________________________________________     RADIOLOGY  None  ____________________________________________   PROCEDURES  Procedures  ____________________________________________   INITIAL IMPRESSION / ASSESSMENT AND PLAN / ED COURSE  Pertinent labs & imaging results that were available during my care of the patient were reviewed by me and considered in my medical decision making (see chart for details).   Patient presented to the emergency department today because of concerns for vaginal bleeding.  Differential would be broad including infections, lacerations, cancer amongst other etiologies.  Patient's work-up is consistent with a urinary tract infection. Discussed the findings with the patient. Will give first dose of antibiotic in the emergency department.    ____________________________________________   FINAL CLINICAL IMPRESSION(S) / ED DIAGNOSES  Final diagnoses:  Lower urinary tract infection     Note: This dictation was prepared with Dragon dictation. Any transcriptional errors that result from this process are unintentional     Nance Pear, MD 06/03/18 2227

## 2018-06-03 NOTE — ED Notes (Signed)
No peripheral IV placed this visit.    Discharge instructions reviewed with patient. Questions fielded by this RN. Patient verbalizes understanding of instructions. Patient discharged home in stable condition per [facility]. No acute distress noted at time of discharge.    

## 2018-06-03 NOTE — ED Notes (Addendum)
Pt reports pain with urinating for the last week, attempted OTC bladder infection meds 2 days ago without relief, now c/o bleeding from vagina  Pt hx of hysterectomy

## 2018-06-03 NOTE — ED Triage Notes (Signed)
Pt comes into the ED via POV c/o vaginal bleeding and pain that has been going on for a week.  Patient has h/o hysterectomy, but woke up this morning with more pain and pressure than she has had.  Patient states that she has a new sexual partner and states "He is bigger than my previous relationships and I don't know if that has something to do with it or not".   Patient states she also has pain with urination.  Patient in NAD at this time with even and unlabored respirations. Patient states she only had spotting with wiping.

## 2018-11-03 ENCOUNTER — Emergency Department: Payer: Commercial Managed Care - PPO

## 2018-11-03 ENCOUNTER — Encounter: Payer: Self-pay | Admitting: Emergency Medicine

## 2018-11-03 ENCOUNTER — Other Ambulatory Visit: Payer: Self-pay

## 2018-11-03 ENCOUNTER — Emergency Department
Admission: EM | Admit: 2018-11-03 | Discharge: 2018-11-04 | Disposition: A | Discharge status: Home / Self Care | Payer: Commercial Managed Care - PPO | Attending: Emergency Medicine | Admitting: Emergency Medicine

## 2018-11-03 DIAGNOSIS — Z87891 Personal history of nicotine dependence: Secondary | ICD-10-CM | POA: Diagnosis not present

## 2018-11-03 DIAGNOSIS — R51 Headache: Secondary | ICD-10-CM | POA: Diagnosis present

## 2018-11-03 DIAGNOSIS — I10 Essential (primary) hypertension: Secondary | ICD-10-CM | POA: Insufficient documentation

## 2018-11-03 DIAGNOSIS — R0789 Other chest pain: Secondary | ICD-10-CM | POA: Diagnosis not present

## 2018-11-03 DIAGNOSIS — R519 Headache, unspecified: Secondary | ICD-10-CM

## 2018-11-03 LAB — CBC
HCT: 38.3 % (ref 36.0–46.0)
Hemoglobin: 12.7 g/dL (ref 12.0–15.0)
MCH: 30 pg (ref 26.0–34.0)
MCHC: 33.2 g/dL (ref 30.0–36.0)
MCV: 90.5 fL (ref 80.0–100.0)
NRBC: 0 % (ref 0.0–0.2)
PLATELETS: 291 10*3/uL (ref 150–400)
RBC: 4.23 MIL/uL (ref 3.87–5.11)
RDW: 12.3 % (ref 11.5–15.5)
WBC: 5.7 10*3/uL (ref 4.0–10.5)

## 2018-11-03 LAB — HEPATIC FUNCTION PANEL
ALBUMIN: 3.5 g/dL (ref 3.5–5.0)
ALK PHOS: 31 U/L — AB (ref 38–126)
ALT: 11 U/L (ref 0–44)
AST: 14 U/L — AB (ref 15–41)
TOTAL PROTEIN: 6.8 g/dL (ref 6.5–8.1)
Total Bilirubin: 0.6 mg/dL (ref 0.3–1.2)

## 2018-11-03 LAB — BASIC METABOLIC PANEL
ANION GAP: 4 — AB (ref 5–15)
BUN: 13 mg/dL (ref 6–20)
CALCIUM: 8.6 mg/dL — AB (ref 8.9–10.3)
CO2: 27 mmol/L (ref 22–32)
CREATININE: 0.87 mg/dL (ref 0.44–1.00)
Chloride: 107 mmol/L (ref 98–111)
GFR calc non Af Amer: >60 mL/min (ref >60)
Glucose, Bld: 111 mg/dL — ABNORMAL HIGH (ref 70–99)
Potassium: 3.3 mmol/L — ABNORMAL LOW (ref 3.5–5.1)
SODIUM: 138 mmol/L (ref 135–145)

## 2018-11-03 LAB — HCG, QUANTITATIVE, PREGNANCY

## 2018-11-03 LAB — TROPONIN I

## 2018-11-03 MED ORDER — ACETAMINOPHEN 10 MG/ML IV SOLN
1000.0000 mg | Freq: Once | INTRAVENOUS | Status: AC
  Administered 2018-11-03: 1000 mg via INTRAVENOUS
  Filled 2018-11-03: qty 100

## 2018-11-03 MED ORDER — SODIUM CHLORIDE 0.9% FLUSH
3.0000 mL | Freq: Once | INTRAVENOUS | Status: AC
  Administered 2018-11-03: 3 mL via INTRAVENOUS

## 2018-11-03 MED ORDER — SODIUM CHLORIDE 0.9 % IV BOLUS
1000.0000 mL | Freq: Once | INTRAVENOUS | Status: AC
  Administered 2018-11-03: 1000 mL via INTRAVENOUS

## 2018-11-03 MED ORDER — KETOROLAC TROMETHAMINE 30 MG/ML IJ SOLN
15.0000 mg | Freq: Once | INTRAMUSCULAR | Status: AC
  Administered 2018-11-03: 15 mg via INTRAVENOUS
  Filled 2018-11-03: qty 1

## 2018-11-03 MED ORDER — DIPHENHYDRAMINE HCL 50 MG/ML IJ SOLN
25.0000 mg | Freq: Once | INTRAMUSCULAR | Status: AC
  Administered 2018-11-03: 25 mg via INTRAVENOUS
  Filled 2018-11-03: qty 1

## 2018-11-03 MED ORDER — PROCHLORPERAZINE EDISYLATE 10 MG/2ML IJ SOLN
10.0000 mg | Freq: Once | INTRAMUSCULAR | Status: AC
  Administered 2018-11-03: 10 mg via INTRAVENOUS
  Filled 2018-11-03: qty 2

## 2018-11-03 MED ORDER — IOHEXOL 350 MG/ML SOLN
75.0000 mL | Freq: Once | INTRAVENOUS | Status: AC | PRN
  Administered 2018-11-03: 75 mL via INTRAVENOUS

## 2018-11-03 NOTE — ED Triage Notes (Addendum)
Pt reports headache to entire head "all day today"; rates 10/10 throbbing pain-"it's just irritating"; has not taken any medication for pain; "don't nothing help it"; pt also having pain to the center of her chest for "a few hours"; rates pain 8/10; no radiating; denies cough/cold symptoms; denies cardiac history; pt says she was seen by her provider a week ago and put on medication for depression as well as antibiotic for UTI

## 2018-11-03 NOTE — ED Provider Notes (Signed)
[facility] Emergency Department Provider Note  ____________________________________________   First MD Initiated Contact with Patient 11/03/18 2259     (approximate)  I have reviewed the triage vital signs and the nursing notes.   HISTORY  Chief Complaint Chest Pain and Headache   HPI Anita [name] is a 33 y.o. female who comes to the emergency department with vaginal onset not maximal onset bifrontal throbbing headache that is different from previous headaches she is ever had that began earlier today.  The pain is now severe and throbbing.  Nonradiating.  She has taken no medications for the pain.  She also reports moderate to severe aching discomfort in her upper chest that began a few hours after the headache began.  Headache is nonradiating.  No fevers or chills.  The chest pain is not ripping or tearing and does not go straight to her back.  She has no history of cardiac disease.  No double vision or blurred vision.  She does have a history of hypertension.   Past Medical History:  Diagnosis Date  . Anemia   . Asthma   . Fibroid uterus   . Heavy periods   . Pelvic peritoneal adhesions, female   . S/P ectopic pregnancy 12/19/2014   Left tubal pregnancy, excised.  . S/P tubal ligation     Patient Active Problem List   Diagnosis Date Noted  . Elevated blood pressure 05/10/2016  . Increased BMI 05/10/2016  . Status post hysterectomy with Lt oophorectomy 04/06/2016  . Endometriosis of ovary 04/04/2016  . Pelvic peritoneal adhesions, female 05/05/2015  . Status post ectopic pregnancy 05/05/2015    Past Surgical History:  Procedure Laterality Date  . BILATERAL SALPINGECTOMY  12/2014   s/p left ectopic pregnancy  . HYSTERECTOMY ABDOMINAL WITH SALPINGECTOMY Bilateral 04/04/2016   Procedure: HYSTERECTOMY ABDOMINAL ;  Surgeon: Brayton Mars, MD;  Location: [facility] ORS;  Service: Gynecology;  Laterality: Bilateral;  . OOPHORECTOMY Left 04/04/2016    Procedure: OOPHORECTOMY;  Surgeon: Brayton Mars, MD;  Location: [facility] ORS;  Service: Gynecology;  Laterality: Left;  . TUBAL LIGATION      Prior to Admission medications   Medication Sig Start Date End Date Taking? Authorizing Provider  escitalopram (LEXAPRO) 10 MG tablet Take 10 mg by mouth daily.   Yes [provider]  nitrofurantoin (MACRODANTIN) 100 MG capsule Take 100 mg by mouth 2 (two) times daily.   Yes [provider]  butalbital-acetaminophen-caffeine (FIORICET, ESGIC) 50-325-40 MG tablet Take 1-2 tablets by mouth every 6 (six) hours as needed. 11/04/18 11/04/19  Darel Hong, MD  ibuprofen (ADVIL,MOTRIN) 600 MG tablet Take 1 tablet (600 mg total) by mouth every 8 (eight) hours as needed. 11/04/18   Darel Hong, MD    Allergies Progestins  Family History  Problem Relation Age of Onset  . Liver cancer Mother   . Diabetes Paternal Grandmother   . Heart disease Neg Hx     Social History Social History   Tobacco Use  . Smoking status: Never Smoker  . Smokeless tobacco: Never Used  Substance Use Topics  . Alcohol use: Yes    Comment: occ.  . Drug use: No    Review of Systems Constitutional: No fever/chills Eyes: No visual changes. ENT: No sore throat. Cardiovascular: Positive for chest pain. Respiratory: Denies shortness of breath. Gastrointestinal: No abdominal pain.  No nausea, no vomiting.  No diarrhea.  No constipation. Genitourinary: Negative for dysuria. Musculoskeletal: Negative for back pain. Skin: Negative  for rash. Neurological: Positive for headache   ____________________________________________   PHYSICAL EXAM:  VITAL SIGNS: ED Triage Vitals  Enc Vitals Group     BP 11/03/18 2246 (!) 150/83     Pulse Rate 11/03/18 2246 61     Resp 11/03/18 2246 18     Temp 11/03/18 2246 98.3 F (36.8 C)     Temp Source 11/03/18 2246 Oral     SpO2 11/03/18 2246 98 %     Weight 11/03/18 2248 257 lb (116.6 kg)     Height  11/03/18 2248 5\' 9"  (1.753 m)     Head Circumference --      Peak Flow --      Pain Score 11/03/18 2244 10     Pain Loc --      Pain Edu? --      Excl. in [place]? --     Constitutional: Alert and oriented x4 appears somewhat uncomfortable though nontoxic no diaphoresis speaks in full clear sentences Eyes: PERRL EOMI. midrange and brisk Head: Atraumatic. Nose: No congestion/rhinnorhea. Mouth/Throat: No trismus Neck: No stridor.  No meningismus Cardiovascular: Normal rate, regular rhythm. Grossly normal heart sounds.  Good peripheral circulation. Respiratory: Normal respiratory effort.  No retractions. Lungs CTAB and moving good air Gastrointestinal: Soft nontender Musculoskeletal: No lower extremity edema   Neurologic:  Normal speech and language. No gross focal neurologic deficits are appreciated. Skin:  Skin is warm, dry and intact. No rash noted. Psychiatric: Mood and affect are normal. Speech and behavior are normal.    ____________________________________________   DIFFERENTIAL includes but not limited to  Aortic dissection, carotid artery dissection, vertebral artery dissection, tension headache, migraine headache, pulmonary embolism ____________________________________________   LABS (all labs ordered are listed, but only abnormal results are displayed)  Labs Reviewed  BASIC METABOLIC PANEL - Abnormal; Notable for the following components:      Result Value   Potassium 3.3 (*)    Glucose, Bld 111 (*)    Calcium 8.6 (*)    Anion gap 4 (*)    All other components within normal limits  HEPATIC FUNCTION PANEL - Abnormal; Notable for the following components:   AST 14 (*)    Alkaline Phosphatase 31 (*)    All other components within normal limits  CBC  TROPONIN I  HCG, QUANTITATIVE, PREGNANCY    Lab work reviewed by me with no signs of acute ischemia and she is not pregnant __________________________________________  EKG  ED ECG REPORT I, Darel Hong, the  attending physician, personally viewed and interpreted this ECG.  Date: 11/06/2018 EKG Time:  Rate: 61 Rhythm: normal sinus rhythm QRS Axis: normal Intervals: normal ST/T Wave abnormalities: normal Narrative Interpretation: no evidence of acute ischemia  ____________________________________________  RADIOLOGY  Chest x-ray reviewed by me with no acute disease CT angiogram of the head neck reviewed by me with no acute disease ____________________________________________   PROCEDURES  Procedure(s) performed: no  Procedures  Critical Care performed: no  ____________________________________________   INITIAL IMPRESSION / ASSESSMENT AND PLAN / ED COURSE  Pertinent labs & imaging results that were available during my care of the patient were reviewed by me and considered in my medical decision making (see chart for details).   As part of my medical decision making, I reviewed the following data within the [place] History obtained from family if available, nursing notes, old chart and ekg, as well as notes from prior ED visits.  The patient comes to the emergency department  with severe headache unlike any headache she has ever had before along with atypical chest pain.  Is unclear if these 2 are related and chest pain with headache in an obese hypertensive woman does raise concern for aortic dissection versus carotid artery/vertebral artery dissection.  I have given her IV Tylenol along with IV Compazine and Benadryl for the headache and she will require CT angiogram of her head and neck.  Following medication the patient's headache is nearly completely resolved.  CT scans are fortunately reassuring.  No signs of acute ischemia.  She will be discharged home with a short course of Fioricet for breakthrough pain and strict return precautions been given.      ____________________________________________   FINAL CLINICAL IMPRESSION(S) / ED DIAGNOSES  Final  diagnoses:  Nonintractable headache, unspecified chronicity pattern, unspecified headache type  Atypical chest pain      NEW MEDICATIONS STARTED DURING THIS VISIT:  Discharge Medication List as of 11/04/2018 12:49 AM    START taking these medications   Details  butalbital-acetaminophen-caffeine (FIORICET, ESGIC) 50-325-40 MG tablet Take 1-2 tablets by mouth every 6 (six) hours as needed., Starting Sun 11/04/2018, Until Mon 11/04/2019, Print         Note:  This document was prepared using Dragon voice recognition software and may include unintentional dictation errors.    Darel Hong, MD 11/06/18 [phone]

## 2018-11-04 ENCOUNTER — Emergency Department: Payer: Commercial Managed Care - PPO

## 2018-11-04 MED ORDER — DEXAMETHASONE SODIUM PHOSPHATE 10 MG/ML IJ SOLN
10.0000 mg | Freq: Once | INTRAMUSCULAR | Status: AC
  Administered 2018-11-04: 10 mg via INTRAVENOUS
  Filled 2018-11-04: qty 1

## 2018-11-04 MED ORDER — BUTALBITAL-APAP-CAFFEINE 50-325-40 MG PO TABS: 1.0000 | ORAL_TABLET | Freq: Four times a day (QID) | ORAL | 0 refills | Status: DC | PRN

## 2018-11-04 MED ORDER — IBUPROFEN 600 MG PO TABS: 600.0000 mg | ORAL_TABLET | Freq: Three times a day (TID) | ORAL | 0 refills | Status: DC | PRN

## 2018-11-04 NOTE — Discharge Instructions (Signed)
Fortunately today your CT scans and your lab work are very reassuring.  Please take your pain medication as needed for severe symptoms and make an appointment to follow-up with your primary care this coming Monday for recheck.  Return to the emergency department sooner for any concerns.  It was a pleasure to take care of you today, and thank you for coming to our emergency department.  If you have any questions or concerns before leaving please ask the nurse to grab me and I'm more than happy to go through your aftercare instructions again.  If you were prescribed any opioid pain medication today such as Norco, Vicodin, Percocet, morphine, hydrocodone, or oxycodone please make sure you do not drive when you are taking this medication as it can alter your ability to drive safely.  If you have any concerns once you are home that you are not improving or are in fact getting worse before you can make it to your follow-up appointment, please do not hesitate to call 911 and come back for further evaluation.  Darel Hong, MD  Results for orders placed or performed during the hospital encounter of 09/98/33  Basic metabolic panel  Result Value Ref Range   Sodium 138 135 - 145 mmol/L   Potassium 3.3 (L) 3.5 - 5.1 mmol/L   Chloride 107 98 - 111 mmol/L   CO2 27 22 - 32 mmol/L   Glucose, Bld 111 (H) 70 - 99 mg/dL   BUN 13 6 - 20 mg/dL   Creatinine, Ser 0.87 0.44 - 1.00 mg/dL   Calcium 8.6 (L) 8.9 - 10.3 mg/dL   GFR calc non Af Amer >60 >60 mL/min   GFR calc Af Amer >60 >60 mL/min   Anion gap 4 (L) 5 - 15  CBC  Result Value Ref Range   WBC 5.7 4.0 - 10.5 K/uL   RBC 4.23 3.87 - 5.11 MIL/uL   Hemoglobin 12.7 12.0 - 15.0 g/dL   HCT 38.3 36.0 - 46.0 %   MCV 90.5 80.0 - 100.0 fL   MCH 30.0 26.0 - 34.0 pg   MCHC 33.2 30.0 - 36.0 g/dL   RDW 12.3 11.5 - 15.5 %   Platelets 291 150 - 400 K/uL   nRBC 0.0 0.0 - 0.2 %  Troponin I - ONCE - STAT  Result Value Ref Range   Troponin I <0.03 <0.03 ng/mL    Hepatic function panel  Result Value Ref Range   Total Protein 6.8 6.5 - 8.1 g/dL   Albumin 3.5 3.5 - 5.0 g/dL   AST 14 (L) 15 - 41 U/L   ALT 11 0 - 44 U/L   Alkaline Phosphatase 31 (L) 38 - 126 U/L   Total Bilirubin 0.6 0.3 - 1.2 mg/dL   Bilirubin, Direct <0.1 0.0 - 0.2 mg/dL   Indirect Bilirubin NOT CALCULATED 0.3 - 0.9 mg/dL  hCG, quantitative, pregnancy  Result Value Ref Range   hCG, Beta Chain, Quant, S <1 <5 mIU/mL   Ct Angio Head W Or Wo Contrast  Result Date: 11/04/2018 CLINICAL DATA:  Initial evaluation for acute headache. EXAM: CT ANGIOGRAPHY HEAD AND NECK TECHNIQUE: Multidetector CT imaging of the head and neck was performed using the standard protocol during bolus administration of intravenous contrast. Multiplanar CT image reconstructions and MIPs were obtained to evaluate the vascular anatomy. Carotid stenosis measurements (when applicable) are obtained utilizing NASCET criteria, using the distal internal carotid diameter as the denominator. CONTRAST:  57mL OMNIPAQUE IOHEXOL 350 MG/ML SOLN  COMPARISON:  None. FINDINGS: CT HEAD FINDINGS Brain: Cerebral volume within normal limits for patient age. No evidence for acute intracranial hemorrhage. No findings to suggest acute large vessel territory infarct. No mass lesion, midline shift, or mass effect. Ventricles are normal in size without evidence for hydrocephalus. No extra-axial fluid collection identified. Vascular: No hyperdense vessel identified. Skull: Scalp soft tissues demonstrate no acute abnormality. Calvarium intact. Sinuses/Orbits: Globes and orbital soft tissues within normal limits. Visualized paranasal sinuses are clear. No mastoid effusion. CTA NECK FINDINGS Aortic arch: Visualized aortic arch of normal caliber with normal branch pattern. No flow-limiting stenosis about the origin the great vessels. Visualized subclavian arteries widely patent. Right carotid system: Right common and internal carotid arteries widely patent  without stenosis, dissection or occlusion. Left carotid system: Left common and internal carotid arteries widely patent without stenosis, dissection, or occlusion. Vertebral arteries: Both vertebral arteries arise from the subclavian arteries. Vertebral arteries widely patent within the neck without stenosis, dissection, or occlusion. Skeleton: No acute osseous abnormality. No discrete lytic or blastic osseous lesions. Scattered dental caries noted. Other neck: No other acute soft tissue abnormality within the neck. Upper chest: Visualized upper chest demonstrates no acute finding. Review of the MIP images confirms the above findings CTA HEAD FINDINGS Anterior circulation: Internal carotid arteries widely patent to the termini without stenosis. A1 segments, anterior communicating artery common anterior cerebral arteries widely patent. Middle cerebral arteries well perfused and widely patent bilaterally. Posterior circulation: Vertebral arteries widely patent to the vertebrobasilar junction without stenosis. Posterior inferior cerebral arteries patent bilaterally. Basilar artery widely patent to its distal aspect without stenosis. Superior cerebellar and posterior cerebral arteries well perfused and widely patent bilaterally. Small left posterior communicating artery noted. Venous sinuses: Patent. Small ovoid filling defect at the superior sagittal sinus felt to be most consistent with an arachnoid granulation. Anatomic variants: None significant. Delayed phase: No abnormal enhancement. Review of the MIP images confirms the above findings IMPRESSION: Normal CTA of the head and neck. No aneurysm or other acute vascular abnormality. No acute intracranial process. Electronically Signed   By: Jeannine Boga M.D.   On: 11/04/2018 00:30   Ct Angio Neck W And/or Wo Contrast  Result Date: 11/04/2018 CLINICAL DATA:  Initial evaluation for acute headache. EXAM: CT ANGIOGRAPHY HEAD AND NECK TECHNIQUE: Multidetector CT  imaging of the head and neck was performed using the standard protocol during bolus administration of intravenous contrast. Multiplanar CT image reconstructions and MIPs were obtained to evaluate the vascular anatomy. Carotid stenosis measurements (when applicable) are obtained utilizing NASCET criteria, using the distal internal carotid diameter as the denominator. CONTRAST:  12mL OMNIPAQUE IOHEXOL 350 MG/ML SOLN COMPARISON:  None. FINDINGS: CT HEAD FINDINGS Brain: Cerebral volume within normal limits for patient age. No evidence for acute intracranial hemorrhage. No findings to suggest acute large vessel territory infarct. No mass lesion, midline shift, or mass effect. Ventricles are normal in size without evidence for hydrocephalus. No extra-axial fluid collection identified. Vascular: No hyperdense vessel identified. Skull: Scalp soft tissues demonstrate no acute abnormality. Calvarium intact. Sinuses/Orbits: Globes and orbital soft tissues within normal limits. Visualized paranasal sinuses are clear. No mastoid effusion. CTA NECK FINDINGS Aortic arch: Visualized aortic arch of normal caliber with normal branch pattern. No flow-limiting stenosis about the origin the great vessels. Visualized subclavian arteries widely patent. Right carotid system: Right common and internal carotid arteries widely patent without stenosis, dissection or occlusion. Left carotid system: Left common and internal carotid arteries widely patent without stenosis, dissection,  or occlusion. Vertebral arteries: Both vertebral arteries arise from the subclavian arteries. Vertebral arteries widely patent within the neck without stenosis, dissection, or occlusion. Skeleton: No acute osseous abnormality. No discrete lytic or blastic osseous lesions. Scattered dental caries noted. Other neck: No other acute soft tissue abnormality within the neck. Upper chest: Visualized upper chest demonstrates no acute finding. Review of the MIP images  confirms the above findings CTA HEAD FINDINGS Anterior circulation: Internal carotid arteries widely patent to the termini without stenosis. A1 segments, anterior communicating artery common anterior cerebral arteries widely patent. Middle cerebral arteries well perfused and widely patent bilaterally. Posterior circulation: Vertebral arteries widely patent to the vertebrobasilar junction without stenosis. Posterior inferior cerebral arteries patent bilaterally. Basilar artery widely patent to its distal aspect without stenosis. Superior cerebellar and posterior cerebral arteries well perfused and widely patent bilaterally. Small left posterior communicating artery noted. Venous sinuses: Patent. Small ovoid filling defect at the superior sagittal sinus felt to be most consistent with an arachnoid granulation. Anatomic variants: None significant. Delayed phase: No abnormal enhancement. Review of the MIP images confirms the above findings IMPRESSION: Normal CTA of the head and neck. No aneurysm or other acute vascular abnormality. No acute intracranial process. Electronically Signed   By: Jeannine Boga M.D.   On: 11/04/2018 00:30

## 2019-01-06 ENCOUNTER — Emergency Department
Admission: EM | Admit: 2019-01-06 | Discharge: 2019-01-06 | Disposition: A | Discharge status: Home / Self Care | Payer: Commercial Managed Care - PPO | Attending: Emergency Medicine | Admitting: Emergency Medicine

## 2019-01-06 ENCOUNTER — Encounter: Payer: Self-pay | Admitting: Emergency Medicine

## 2019-01-06 ENCOUNTER — Other Ambulatory Visit: Payer: Self-pay

## 2019-01-06 DIAGNOSIS — R51 Headache: Secondary | ICD-10-CM | POA: Insufficient documentation

## 2019-01-06 DIAGNOSIS — J45909 Unspecified asthma, uncomplicated: Secondary | ICD-10-CM | POA: Diagnosis not present

## 2019-01-06 DIAGNOSIS — R519 Headache, unspecified: Secondary | ICD-10-CM

## 2019-01-06 MED ORDER — BUTALBITAL-APAP-CAFFEINE 50-325-40 MG PO TABS
1.0000 | ORAL_TABLET | Freq: Once | ORAL | Status: AC
  Administered 2019-01-06: 1 via ORAL
  Filled 2019-01-06: qty 1

## 2019-01-06 MED ORDER — BUTALBITAL-APAP-CAFFEINE 50-325-40 MG PO TABS: 1.0000 | ORAL_TABLET | ORAL | 0 refills | Status: DC | PRN

## 2019-01-06 MED ORDER — KETOROLAC TROMETHAMINE 30 MG/ML IJ SOLN
30.0000 mg | Freq: Once | INTRAMUSCULAR | Status: AC
  Administered 2019-01-06: 30 mg via INTRAMUSCULAR
  Filled 2019-01-06: qty 1

## 2019-01-06 NOTE — ED Triage Notes (Signed)
Pt reports she has been having headaches for a while, reprots she is supposed to take pills for depression but she is not taking them, "I don't know if that has to do anything with my headaches" Pt reports sensitivity to light. Pt talks in complete sentences no distress noted, denies any other symptom

## 2019-01-06 NOTE — ED Provider Notes (Signed)
[facility] Emergency Department Provider Note   ____________________________________________   First MD Initiated Contact with Patient 01/06/19 0158     (approximate)  I have reviewed the triage vital signs and the nursing notes.   HISTORY  Chief Complaint Headache    HPI Anita [name] is a 33 y.o. female who presents to the ED from home with a chief complaint of headaches.  Patient reports a 3 to 4-week history of frontal headache, constant.  Has not taken anything for the headaches.  She was on Lexapro but stopped it approximately 2 months ago secondary to chest pain.  States Fioricet has worked for her in the past but she ran out.  Denies recent fever, chills, vision changes, neck pain, chest pain, shortness of breath, abdominal pain, nausea or vomiting.  Denies recent travel or trauma.  Denies exposure to persons with known coronavirus.     Past Medical History:  Diagnosis Date  . Anemia   . Asthma   . Fibroid uterus   . Heavy periods   . Pelvic peritoneal adhesions, female   . S/P ectopic pregnancy 12/19/2014   Left tubal pregnancy, excised.  . S/P tubal ligation     Patient Active Problem List   Diagnosis Date Noted  . Elevated blood pressure 05/10/2016  . Increased BMI 05/10/2016  . Status post hysterectomy with Lt oophorectomy 04/06/2016  . Endometriosis of ovary 04/04/2016  . Pelvic peritoneal adhesions, female 05/05/2015  . Status post ectopic pregnancy 05/05/2015    Past Surgical History:  Procedure Laterality Date  . BILATERAL SALPINGECTOMY  12/2014   s/p left ectopic pregnancy  . HYSTERECTOMY ABDOMINAL WITH SALPINGECTOMY Bilateral 04/04/2016   Procedure: HYSTERECTOMY ABDOMINAL ;  Surgeon: Brayton Mars, MD;  Location: [facility] ORS;  Service: Gynecology;  Laterality: Bilateral;  . OOPHORECTOMY Left 04/04/2016   Procedure: OOPHORECTOMY;  Surgeon: Brayton Mars, MD;  Location: [facility] ORS;  Service: Gynecology;  Laterality:  Left;  . TUBAL LIGATION      Prior to Admission medications   Medication Sig Start Date End Date Taking? Authorizing Provider  butalbital-acetaminophen-caffeine (FIORICET, ESGIC) 50-325-40 MG tablet Take 1 tablet by mouth every 4 (four) hours as needed for headache. 01/06/19   Paulette Blanch, MD  escitalopram (LEXAPRO) 10 MG tablet Take 10 mg by mouth daily.    [provider]  ibuprofen (ADVIL,MOTRIN) 600 MG tablet Take 1 tablet (600 mg total) by mouth every 8 (eight) hours as needed. 11/04/18   Darel Hong, MD  nitrofurantoin (MACRODANTIN) 100 MG capsule Take 100 mg by mouth 2 (two) times daily.    [provider]    Allergies Progestins  Family History  Problem Relation Age of Onset  . Liver cancer Mother   . Diabetes Paternal Grandmother   . Heart disease Neg Hx     Social History Social History   Tobacco Use  . Smoking status: Never Smoker  . Smokeless tobacco: Never Used  Substance Use Topics  . Alcohol use: Yes    Comment: occ.  . Drug use: No    Review of Systems  Constitutional: No fever/chills Eyes: No visual changes. ENT: No sore throat. Cardiovascular: Denies chest pain. Respiratory: Denies shortness of breath. Gastrointestinal: No abdominal pain.  No nausea, no vomiting.  No diarrhea.  No constipation. Genitourinary: Negative for dysuria. Musculoskeletal: Negative for back pain. Skin: Negative for rash. Neurological: Positive for headache.  Negative for focal weakness or numbness.   ____________________________________________   PHYSICAL  EXAM:  VITAL SIGNS: ED Triage Vitals  Enc Vitals Group     BP 01/06/19 0041 135/72     Pulse Rate 01/06/19 0041 64     Resp 01/06/19 0041 16     Temp 01/06/19 0041 98.4 F (36.9 C)     Temp Source 01/06/19 0041 Oral     SpO2 01/06/19 0041 99 %     Weight 01/06/19 0051 257 lb (116.6 kg)     Height 01/06/19 0051 5\' 8"  (1.727 m)     Head Circumference --      Peak Flow --      Pain Score  01/06/19 0051 9     Pain Loc --      Pain Edu? --      Excl. in [place]? --     Constitutional: Alert and oriented. Well appearing and in no acute distress. Eyes: Conjunctivae are normal. PERRL. EOMI. Head: Atraumatic. Nose: No congestion/rhinnorhea. Mouth/Throat: Mucous membranes are moist.  Oropharynx non-erythematous. Neck: No stridor.  Supple neck without meningismus. Cardiovascular: Normal rate, regular rhythm. Grossly normal heart sounds.  Good peripheral circulation. Respiratory: Normal respiratory effort.  No retractions. Lungs CTAB. Gastrointestinal: Soft and nontender. No distention. No abdominal bruits. No CVA tenderness. Musculoskeletal: No lower extremity tenderness nor edema.  No joint effusions. Neurologic:  Normal speech and language. No gross focal neurologic deficits are appreciated. No gait instability. Skin:  Skin is warm, dry and intact. No rash noted.  No petechiae. Psychiatric: Mood and affect are normal. Speech and behavior are normal.  ____________________________________________   LABS (all labs ordered are listed, but only abnormal results are displayed)  Labs Reviewed - No data to display ____________________________________________  EKG  None ____________________________________________  RADIOLOGY  ED MD interpretation: None  Official radiology report(s): No results found.  ____________________________________________   PROCEDURES  Procedure(s) performed (including Critical Care):  Procedures   ____________________________________________   INITIAL IMPRESSION / ASSESSMENT AND PLAN / ED COURSE  As part of my medical decision making, I reviewed the following data within the [place] notes reviewed and incorporated, Old chart reviewed and Notes from prior ED visits        33 year old female who presents with a several week history frontal headache. Differential diagnosis includes, but is not limited to,  intracranial hemorrhage, meningitis/encephalitis, previous head trauma, cavernous venous thrombosis, tension headache, temporal arteritis, migraine or migraine equivalent, idiopathic intracranial hypertension, and non-specific headache.  Patient is well-appearing with supple neck and no focal neurological deficits.  Will administer IM Toradol and Fioricet.  Will reassess.   Clinical Course as of Jan 05 713  Sun Jan 06, 2019  0344 Awakened patient from sleep who says her headache is a significantly improved.  Will discharge home with prescription for Fioricet.  Strict return precautions given.  Patient verbalizes understanding and agrees with plan of care.   [JS]    Clinical Course User Index [JS] Paulette Blanch, MD     ____________________________________________   FINAL CLINICAL IMPRESSION(S) / ED DIAGNOSES  Final diagnoses:  Acute nonintractable headache, unspecified headache type     ED Discharge Orders         Ordered    butalbital-acetaminophen-caffeine (FIORICET, ESGIC) 50-325-40 MG tablet  Every 4 hours PRN     01/06/19 0347           Note:  This document was prepared using Dragon voice recognition software and may include unintentional dictation errors.   Paulette Blanch, MD 01/06/19  0720  

## 2019-01-06 NOTE — Discharge Instructions (Addendum)
You may take Fioricet as needed for headache.  Return to the ER for worsening symptoms, persistent vomiting, lethargy or other concerns.

## 2019-03-12 ENCOUNTER — Emergency Department
Admission: EM | Admit: 2019-03-12 | Discharge: 2019-03-12 | Disposition: A | Discharge status: Home / Self Care | Payer: Commercial Managed Care - PPO | Attending: Internal Medicine | Admitting: Internal Medicine

## 2019-03-12 ENCOUNTER — Other Ambulatory Visit: Payer: Self-pay

## 2019-03-12 DIAGNOSIS — J45909 Unspecified asthma, uncomplicated: Secondary | ICD-10-CM | POA: Diagnosis not present

## 2019-03-12 DIAGNOSIS — G43019 Migraine without aura, intractable, without status migrainosus: Secondary | ICD-10-CM | POA: Diagnosis not present

## 2019-03-12 MED ORDER — DIPHENHYDRAMINE HCL 50 MG/ML IJ SOLN
50.0000 mg | Freq: Once | INTRAMUSCULAR | Status: AC
  Administered 2019-03-12: 23:00:00 50 mg via INTRAMUSCULAR
  Filled 2019-03-12: qty 1

## 2019-03-12 MED ORDER — PROMETHAZINE HCL 25 MG/ML IJ SOLN
25.0000 mg | Freq: Once | INTRAMUSCULAR | Status: AC
  Administered 2019-03-12: 25 mg via INTRAMUSCULAR

## 2019-03-12 MED ORDER — KETOROLAC TROMETHAMINE 30 MG/ML IJ SOLN
30.0000 mg | Freq: Once | INTRAMUSCULAR | Status: AC
  Administered 2019-03-12: 30 mg via INTRAMUSCULAR
  Filled 2019-03-12: qty 1

## 2019-03-12 NOTE — ED Triage Notes (Signed)
Hx of migraines, has had migraine for last 2 days. Took prescribed medication for migraine at home with no relief. Pt alert and oriented X4, active, cooperative, pt in NAD. RR even and unlabored, color WNL.

## 2019-03-12 NOTE — ED Provider Notes (Signed)
[facility] Emergency Department Provider Note  ____________________________________________  Time seen: Approximately 10:51 PM  I have reviewed the triage vital signs and the nursing notes.   HISTORY  Chief Complaint Migraine    HPI Anita [name] is a 33 y.o. female who presents the emergency department complaining of frontal migraine.  Patient reports has a history of migraines.  She states that this is a typical migraine with the exception of length of duration.  Patient takes Fioricet for her headaches.  Typically this is effective but she states that this headache has not responded as normal to her medication.  No recent trauma.  She denies any radicular symptoms in the upper extremity.  She denies any difficulty formulating words or thoughts.  No slurred speech.  No weakness on one side of the body or the other.  Patient reports that she typically has very good relief with migraine cocktail and is requesting same.         Past Medical History:  Diagnosis Date  . Anemia   . Asthma   . Fibroid uterus   . Heavy periods   . Pelvic peritoneal adhesions, female   . S/P ectopic pregnancy 12/19/2014   Left tubal pregnancy, excised.  . S/P tubal ligation     Patient Active Problem List   Diagnosis Date Noted  . Elevated blood pressure 05/10/2016  . Increased BMI 05/10/2016  . Status post hysterectomy with Lt oophorectomy 04/06/2016  . Endometriosis of ovary 04/04/2016  . Pelvic peritoneal adhesions, female 05/05/2015  . Status post ectopic pregnancy 05/05/2015    Past Surgical History:  Procedure Laterality Date  . BILATERAL SALPINGECTOMY  12/2014   s/p left ectopic pregnancy  . HYSTERECTOMY ABDOMINAL WITH SALPINGECTOMY Bilateral 04/04/2016   Procedure: HYSTERECTOMY ABDOMINAL ;  Surgeon: Brayton Mars, MD;  Location: [facility] ORS;  Service: Gynecology;  Laterality: Bilateral;  . OOPHORECTOMY Left 04/04/2016   Procedure: OOPHORECTOMY;  Surgeon:  Brayton Mars, MD;  Location: [facility] ORS;  Service: Gynecology;  Laterality: Left;  . TUBAL LIGATION      Prior to Admission medications   Medication Sig Start Date End Date Taking? Authorizing Provider  butalbital-acetaminophen-caffeine (FIORICET, ESGIC) 50-325-40 MG tablet Take 1 tablet by mouth every 4 (four) hours as needed for headache. 01/06/19   Paulette Blanch, MD  escitalopram (LEXAPRO) 10 MG tablet Take 10 mg by mouth daily.    [provider]  ibuprofen (ADVIL,MOTRIN) 600 MG tablet Take 1 tablet (600 mg total) by mouth every 8 (eight) hours as needed. 11/04/18   Darel Hong, MD  nitrofurantoin (MACRODANTIN) 100 MG capsule Take 100 mg by mouth 2 (two) times daily.    [provider]    Allergies Progestins  Family History  Problem Relation Age of Onset  . Liver cancer Mother   . Diabetes Paternal Grandmother   . Heart disease Neg Hx     Social History Social History   Tobacco Use  . Smoking status: Never Smoker  . Smokeless tobacco: Never Used  Substance Use Topics  . Alcohol use: Yes    Comment: occ.  . Drug use: No     Review of Systems  Constitutional: No fever/chills Eyes: No visual changes. No discharge ENT: No upper respiratory complaints. Cardiovascular: no chest pain. Respiratory: no cough. No SOB. Gastrointestinal: No abdominal pain.  No nausea, no vomiting.  No diarrhea.  No constipation. Genitourinary: Negative for dysuria. No hematuria Musculoskeletal: Negative for musculoskeletal pain. Skin: Negative for  rash, abrasions, lacerations, ecchymosis. Neurological: Positive for headache but denies focal weakness or numbness. 10-point ROS otherwise negative.  ____________________________________________   PHYSICAL EXAM:  VITAL SIGNS: ED Triage Vitals  Enc Vitals Group     BP 03/12/19 2159 134/65     Pulse Rate 03/12/19 2159 66     Resp 03/12/19 2159 18     Temp 03/12/19 2159 98.9 F (37.2 C)     Temp Source 03/12/19  2159 Oral     SpO2 03/12/19 2159 99 %     Weight 03/12/19 2200 158 lb (71.7 kg)     Height 03/12/19 2200 5\' 9"  (1.753 m)     Head Circumference --      Peak Flow --      Pain Score 03/12/19 2159 10     Pain Loc --      Pain Edu? --      Excl. in [place]? --      Constitutional: Alert and oriented. Well appearing and in no acute distress. Eyes: Conjunctivae are normal. PERRL. EOMI. Head: Atraumatic. ENT:      Ears:       Nose: No congestion/rhinnorhea.      Mouth/Throat: Mucous membranes are moist.  Neck: No stridor.  Neck is supple full range of motion Hematological/Lymphatic/Immunilogical: No cervical lymphadenopathy. Cardiovascular: Normal rate, regular rhythm. Normal S1 and S2.  Good peripheral circulation. Respiratory: Normal respiratory effort without tachypnea or retractions. Lungs CTAB. Good air entry to the bases with no decreased or absent breath sounds. Musculoskeletal: Full range of motion to all extremities. No gross deformities appreciated. Neurologic:  Normal speech and language. No gross focal neurologic deficits are appreciated.  Cranial nerves II through XII grossly intact.  Negative Romberg's and pronator drift. Skin:  Skin is warm, dry and intact. No rash noted. Psychiatric: Mood and affect are normal. Speech and behavior are normal. Patient exhibits appropriate insight and judgement.   ____________________________________________   LABS (all labs ordered are listed, but only abnormal results are displayed)  Labs Reviewed - No data to display ____________________________________________  EKG   ____________________________________________  RADIOLOGY   No results found.  ____________________________________________    PROCEDURES  Procedure(s) performed:    Procedures    Medications  ketorolac (TORADOL) 30 MG/ML injection 30 mg (has no administration in time range)  promethazine (PHENERGAN) injection 25 mg (has no administration in time range)   diphenhydrAMINE (BENADRYL) injection 50 mg (has no administration in time range)     ____________________________________________   INITIAL IMPRESSION / ASSESSMENT AND PLAN / ED COURSE  Pertinent labs & imaging results that were available during my care of the patient were reviewed by me and considered in my medical decision making (see chart for details).  Review of the [place] CSRS was performed in accordance of the [place] prior to dispensing any controlled drugs.           Patient's diagnosis is consistent with migraine headache.  Patient presents emergency department complaining of migraine headache that has not responded well to her migraine medications.  No atypical features of her migraine.  No significant abnormalities on exam.  No indication for imaging.  Patient will be given migraine cocktail here in the emergency department for symptom relief.  Continue at home prescribed medications as needed.  Follow-up with primary care as needed..  Patient is given ED precautions to return to the ED for any worsening or new symptoms.     ____________________________________________  FINAL CLINICAL IMPRESSION(S) / ED DIAGNOSES  Final diagnoses:  Intractable migraine without aura and without status migrainosus      NEW MEDICATIONS STARTED DURING THIS VISIT:  ED Discharge Orders    None          This chart was dictated using voice recognition software/Dragon. Despite best efforts to proofread, errors can occur which can change the meaning. Any change was purely unintentional.    Darletta Moll, PA-C 03/12/19 2255    Earleen Newport, MD 03/18/19 [phone]

## 2019-03-12 NOTE — ED Notes (Signed)
Patient AAOx4. Vitals stable. NAD. 

## 2019-03-12 NOTE — ED Notes (Signed)
Patient reported that she suffers from chronic migraines. Patient reports centralized pain that started 2 to 3 days ago. Patient reported that she is under constant stress. Patient stated that her pain is a 10/10. No sensitivity to light or sound. Patient reported she is currently taking  Escital Opram for Depression and Butal/Acetamin for migraines.

## 2019-09-16 ENCOUNTER — Encounter: Payer: Self-pay | Admitting: Emergency Medicine

## 2019-09-16 DIAGNOSIS — G43009 Migraine without aura, not intractable, without status migrainosus: Secondary | ICD-10-CM | POA: Diagnosis not present

## 2019-09-16 DIAGNOSIS — R519 Headache, unspecified: Secondary | ICD-10-CM | POA: Diagnosis present

## 2019-09-16 DIAGNOSIS — Z79899 Other long term (current) drug therapy: Secondary | ICD-10-CM | POA: Insufficient documentation

## 2019-09-16 NOTE — ED Triage Notes (Signed)
Pt reports she has had on and off HA that last up to 3 days at a time, x3 months. Pt to ED tonight due to continued HA x3 days. No relief from OTC meds.

## 2019-09-17 ENCOUNTER — Emergency Department
Admission: EM | Admit: 2019-09-17 | Discharge: 2019-09-17 | Disposition: A | Discharge status: Home / Self Care | Payer: Commercial Managed Care - PPO | Attending: Emergency Medicine | Admitting: Emergency Medicine

## 2019-09-17 DIAGNOSIS — G43009 Migraine without aura, not intractable, without status migrainosus: Secondary | ICD-10-CM

## 2019-09-17 MED ORDER — DROPERIDOL 2.5 MG/ML IJ SOLN
2.5000 mg | Freq: Once | INTRAMUSCULAR | Status: AC
  Administered 2019-09-17: 2.5 mg via INTRAVENOUS

## 2019-09-17 MED ORDER — KETOROLAC TROMETHAMINE 30 MG/ML IJ SOLN
15.0000 mg | Freq: Once | INTRAMUSCULAR | Status: AC
  Administered 2019-09-17: 15 mg via INTRAVENOUS
  Filled 2019-09-17: qty 1

## 2019-09-17 MED ORDER — BUTALBITAL-APAP-CAFFEINE 50-325-40 MG PO TABS: 1.0000 | ORAL_TABLET | ORAL | 0 refills | Status: DC | PRN

## 2019-09-17 MED ORDER — DEXAMETHASONE SODIUM PHOSPHATE 10 MG/ML IJ SOLN
10.0000 mg | Freq: Once | INTRAMUSCULAR | Status: AC
  Administered 2019-09-17: 10 mg via INTRAVENOUS
  Filled 2019-09-17: qty 1

## 2019-09-17 NOTE — ED Notes (Signed)
Pt asleep in bed, pt woke up states she is feeling better

## 2019-09-17 NOTE — ED Provider Notes (Signed)
[facility] Emergency Department Provider Note  ____________________________________________   First MD Initiated Contact with Patient 09/17/19 [phone]     (approximate)  I have reviewed the triage vital signs and the nursing notes.   HISTORY  Chief Complaint Headache    HPI Anita [name] is a 33 y.o. female with medical history as listed below who has had multiple visits in the past for headache versus migraine.  She presents tonight for persistent headache for the last 3 days.  She said it is mostly in the front and it feels typical of her usual migraines.  She was seen in this emergency department about 11 months ago as an initial presentation of worst headache of her life and had CTA head and neck which was reassuring.  She has had several visits since that time and feels better after treatment for migraine.  She said that she sometimes sees spots but otherwise has no visual changes.  She has had no nausea nor vomiting.  Nothing in particular makes his symptoms better or worse.  No numbness nor weakness in her extremities.  No known contact with COVID-19 patients.  She denies fever, neck stiffness, sore throat, loss of smell or taste, chest pain, cough, shortness of breath, nausea, vomiting, abdominal pain, and dysuria.  She describes the pain is severe.  She has taken oral medication at home including ibuprofen and Tylenol but it has not helped.         Past Medical History:  Diagnosis Date  . Anemia   . Asthma   . Fibroid uterus   . Heavy periods   . Pelvic peritoneal adhesions, female   . S/P ectopic pregnancy 12/19/2014   Left tubal pregnancy, excised.  . S/P tubal ligation     Patient Active Problem List   Diagnosis Date Noted  . Elevated blood pressure 05/10/2016  . Increased BMI 05/10/2016  . Status post hysterectomy with Lt oophorectomy 04/06/2016  . Endometriosis of ovary 04/04/2016  . Pelvic peritoneal adhesions, female 05/05/2015  .  Status post ectopic pregnancy 05/05/2015    Past Surgical History:  Procedure Laterality Date  . BILATERAL SALPINGECTOMY  12/2014   s/p left ectopic pregnancy  . HYSTERECTOMY ABDOMINAL WITH SALPINGECTOMY Bilateral 04/04/2016   Procedure: HYSTERECTOMY ABDOMINAL ;  Surgeon: Brayton Mars, MD;  Location: [facility] ORS;  Service: Gynecology;  Laterality: Bilateral;  . OOPHORECTOMY Left 04/04/2016   Procedure: OOPHORECTOMY;  Surgeon: Brayton Mars, MD;  Location: [facility] ORS;  Service: Gynecology;  Laterality: Left;  . TUBAL LIGATION      Prior to Admission medications   Medication Sig Start Date End Date Taking? Authorizing Provider  butalbital-acetaminophen-caffeine (FIORICET) 50-325-40 MG tablet Take 1 tablet by mouth every 4 (four) hours as needed for headache. 09/17/19   Hinda Kehr, MD  escitalopram (LEXAPRO) 10 MG tablet Take 10 mg by mouth daily.    [provider]  ibuprofen (ADVIL,MOTRIN) 600 MG tablet Take 1 tablet (600 mg total) by mouth every 8 (eight) hours as needed. 11/04/18   Darel Hong, MD  nitrofurantoin (MACRODANTIN) 100 MG capsule Take 100 mg by mouth 2 (two) times daily.    [provider]    Allergies Progestins  Family History  Problem Relation Age of Onset  . Liver cancer Mother   . Diabetes Paternal Grandmother   . Heart disease Neg Hx     Social History Social History   Tobacco Use  . Smoking status: Never Smoker  .  Smokeless tobacco: Never Used  Substance Use Topics  . Alcohol use: Yes    Comment: occ.  . Drug use: No    Review of Systems Constitutional: No fever/chills Eyes: No visual changes. ENT: No sore throat. Cardiovascular: Denies chest pain. Respiratory: Denies shortness of breath. Gastrointestinal: No abdominal pain.  No nausea, no vomiting.  No diarrhea.  No constipation. Genitourinary: Negative for dysuria. Musculoskeletal: Negative for neck pain.  Negative for back pain. Integumentary: Negative for rash.  Neurological: Frontal headache as described above.  No numbness nor weakness in her extremities.   ____________________________________________   PHYSICAL EXAM:  VITAL SIGNS: ED Triage Vitals [09/16/19 2353]  Enc Vitals Group     BP 135/70     Pulse Rate 70     Resp 17     Temp 99.3 F (37.4 C)     Temp Source Oral     SpO2 98 %     Weight      Height      Head Circumference      Peak Flow      Pain Score      Pain Loc      Pain Edu?      Excl. in [place]?     Constitutional: Alert and oriented.  No acute distress. Eyes: Conjunctivae are normal.  Pupils are equal and reactive bilaterally, extraocular motion is intact. Head: Atraumatic. Nose: No congestion/rhinnorhea. Mouth/Throat: Patient is wearing a mask. Neck: No stridor.  No meningeal signs.   Cardiovascular: Normal rate, regular rhythm. Good peripheral circulation. Grossly normal heart sounds. Respiratory: Normal respiratory effort.  No retractions. Gastrointestinal: Soft and nontender. No distention.  Musculoskeletal: No lower extremity tenderness nor edema. No gross deformities of extremities. Neurologic:  Normal speech and language. No gross focal neurologic deficits are appreciated.  Skin:  Skin is warm, dry and intact. Psychiatric: Mood and affect are normal. Speech and behavior are normal.  ____________________________________________   LABS (all labs ordered are listed, but only abnormal results are displayed)  Labs Reviewed - No data to display ____________________________________________  EKG  None - EKG not ordered by ED physician ____________________________________________  RADIOLOGY I, Hinda Kehr, personally viewed and evaluated these images (plain radiographs) as part of my medical decision making, as well as reviewing the written report by the radiologist.  ED MD interpretation: No indication for emergent imaging  Official radiology report(s): No results found.   ____________________________________________   PROCEDURES   Procedure(s) performed (including Critical Care):  Procedures   ____________________________________________   INITIAL IMPRESSION / MDM / ASSESSMENT AND PLAN / ED COURSE  As part of my medical decision making, I reviewed the following data within the [place] notes reviewed and incorporated, Labs reviewed , Old chart reviewed, Notes from prior ED visits and [place] Controlled Substance Database   Differential diagnosis includes, but is not limited to, intracranial hemorrhage, meningitis/encephalitis, previous head trauma, cavernous venous thrombosis, tension headache, temporal arteritis, migraine or migraine equivalent, idiopathic intracranial hypertension, and non-specific headache.  The patient presents with a headache similar to her prior headaches.  She has not seen a neurologist previously.  She is used Fioricet at home in the past which is helped.  She has no warning signs or symptoms at this time and she has had CT imaging of her head and neck in the past without any concerning abnormalities.  I will treat empirically with the following combination: Droperidol 2.5 mg IV, Decadron 10 mg IV, and Toradol 15 mg  IV.  I will then reassess and see how she is feeling.  She is not vomiting and does not appear volume depleted.  I anticipate discharge and outpatient follow-up.      Clinical Course as of Sep 16 538  Tue Sep 17, 2019  0446 Patient is sleeping comfortably.  She awoke easily to soft voice.  She is comfortable with the plan for discharge and says she feels much better now.  I gave my usual and customary return precautions.  She said that the Fioricet previously prescribed for her did help with her headache so I wrote another prescription.   [CF]    Clinical Course User Index [CF] Hinda Kehr, MD     ____________________________________________  FINAL CLINICAL IMPRESSION(S) / ED DIAGNOSES   Final diagnoses:  Migraine without aura and without status migrainosus, not intractable     MEDICATIONS GIVEN DURING THIS VISIT:  Medications  droperidol (INAPSINE) 2.5 MG/ML injection 2.5 mg (2.5 mg Intravenous Given 09/17/19 0352)  dexamethasone (DECADRON) injection 10 mg (10 mg Intravenous Given 09/17/19 0352)  ketorolac (TORADOL) 30 MG/ML injection 15 mg (15 mg Intravenous Given 09/17/19 0351)     ED Discharge Orders         Ordered    butalbital-acetaminophen-caffeine (FIORICET) 50-325-40 MG tablet  Every 4 hours PRN     09/17/19 0447          *Please note:  Neeley M Greenfield was evaluated in Emergency Department on 09/17/2019 for the symptoms described in the history of present illness. She was evaluated in the context of the global COVID-19 pandemic, which necessitated consideration that the patient might be at risk for infection with the SARS-CoV-2 virus that causes COVID-19. Institutional protocols and algorithms that pertain to the evaluation of patients at risk for COVID-19 are in a state of rapid change based on information released by regulatory bodies including the CDC and federal and state organizations. These policies and algorithms were followed during the patient's care in the ED.  Some ED evaluations and interventions may be delayed as a result of limited staffing during the pandemic.*  Note:  This document was prepared using Dragon voice recognition software and may include unintentional dictation errors.   Hinda Kehr, MD 09/17/19 [phone]

## 2019-09-17 NOTE — Discharge Instructions (Signed)

## 2020-03-09 ENCOUNTER — Emergency Department: Payer: HRSA Program

## 2020-03-09 ENCOUNTER — Encounter: Payer: Self-pay | Admitting: Emergency Medicine

## 2020-03-09 ENCOUNTER — Other Ambulatory Visit: Payer: Self-pay

## 2020-03-09 ENCOUNTER — Emergency Department
Admission: EM | Admit: 2020-03-09 | Discharge: 2020-03-09 | Disposition: A | Discharge status: Home / Self Care | Payer: HRSA Program | Attending: Emergency Medicine | Admitting: Emergency Medicine

## 2020-03-09 ENCOUNTER — Telehealth: Payer: Self-pay | Admitting: Emergency Medicine

## 2020-03-09 DIAGNOSIS — J45909 Unspecified asthma, uncomplicated: Secondary | ICD-10-CM | POA: Insufficient documentation

## 2020-03-09 DIAGNOSIS — U071 COVID-19: Secondary | ICD-10-CM | POA: Diagnosis not present

## 2020-03-09 DIAGNOSIS — J1282 Pneumonia due to coronavirus disease 2019: Secondary | ICD-10-CM | POA: Diagnosis not present

## 2020-03-09 DIAGNOSIS — Z79899 Other long term (current) drug therapy: Secondary | ICD-10-CM | POA: Insufficient documentation

## 2020-03-09 DIAGNOSIS — Z20822 Contact with and (suspected) exposure to covid-19: Secondary | ICD-10-CM | POA: Diagnosis not present

## 2020-03-09 DIAGNOSIS — R079 Chest pain, unspecified: Secondary | ICD-10-CM | POA: Diagnosis present

## 2020-03-09 DIAGNOSIS — J189 Pneumonia, unspecified organism: Secondary | ICD-10-CM

## 2020-03-09 LAB — BASIC METABOLIC PANEL
Anion gap: 6 (ref 5–15)
BUN: 15 mg/dL (ref 6–20)
CO2: 26 mmol/L (ref 22–32)
Calcium: 8 mg/dL — ABNORMAL LOW (ref 8.9–10.3)
Chloride: 105 mmol/L (ref 98–111)
Creatinine, Ser: 0.63 mg/dL (ref 0.44–1.00)
GFR calc Af Amer: >60 mL/min (ref >60)
GFR calc non Af Amer: >60 mL/min (ref >60)
Glucose, Bld: 95 mg/dL (ref 70–99)
Potassium: 3.2 mmol/L — ABNORMAL LOW (ref 3.5–5.1)
Sodium: 137 mmol/L (ref 135–145)

## 2020-03-09 LAB — CBC
HCT: 35.4 % — ABNORMAL LOW (ref 36.0–46.0)
Hemoglobin: 12 g/dL (ref 12.0–15.0)
MCH: 30.5 pg (ref 26.0–34.0)
MCHC: 33.9 g/dL (ref 30.0–36.0)
MCV: 90.1 fL (ref 80.0–100.0)
Platelets: 211 10*3/uL (ref 150–400)
RBC: 3.93 MIL/uL (ref 3.87–5.11)
RDW: 12.2 % (ref 11.5–15.5)
WBC: 2.8 10*3/uL — ABNORMAL LOW (ref 4.0–10.5)
nRBC: 0 % (ref 0.0–0.2)

## 2020-03-09 LAB — SARS CORONAVIRUS 2 BY RT PCR (HOSPITAL ORDER, PERFORMED IN ~~LOC~~ HOSPITAL LAB): SARS Coronavirus 2: POSITIVE — AB

## 2020-03-09 LAB — TROPONIN I (HIGH SENSITIVITY): Troponin I (High Sensitivity): 4 ng/L (ref <18)

## 2020-03-09 MED ORDER — AMOXICILLIN 500 MG PO CAPS
1000.0000 mg | ORAL_CAPSULE | Freq: Once | ORAL | Status: AC
  Administered 2020-03-09: 1000 mg via ORAL
  Filled 2020-03-09: qty 2

## 2020-03-09 MED ORDER — AZITHROMYCIN 500 MG PO TABS
500.0000 mg | ORAL_TABLET | Freq: Once | ORAL | Status: AC
  Administered 2020-03-09: 500 mg via ORAL
  Filled 2020-03-09: qty 1

## 2020-03-09 MED ORDER — SODIUM CHLORIDE 0.9% FLUSH: 3.0000 mL | Freq: Once | INTRAVENOUS | Status: DC

## 2020-03-09 MED ORDER — AZITHROMYCIN 500 MG PO TABS: 500.0000 mg | ORAL_TABLET | Freq: Every day | ORAL | 0 refills | Status: AC

## 2020-03-09 MED ORDER — AMOXICILLIN 500 MG PO CAPS: 1000.0000 mg | ORAL_CAPSULE | Freq: Three times a day (TID) | ORAL | 0 refills | Status: AC

## 2020-03-09 NOTE — ED Notes (Signed)
Patient discharged home, patient received discharge papers and prescriptions. Patient appropriate and cooperative,  Vital signs taken. NAD noted.

## 2020-03-09 NOTE — ED Notes (Signed)
Patient came in for chest pain and headache. Patient rated pain for chest 9/10 on right side.

## 2020-03-09 NOTE — ED Triage Notes (Signed)
C/O generalized chest pain x 1 day.  Also c/o headache x 2 days.  AAOx3.  Skin warm and dry. NAD

## 2020-03-09 NOTE — Telephone Encounter (Signed)
Called patient and informed of positive covid test.  She says she is unsure when symptoms started--never lost taste or smell.  cdc guidelines for isolation and quarnatine of contacts explained.

## 2020-03-09 NOTE — ED Notes (Signed)
Patient did not need a second Troponin or POC pregnancy test per Dr. Corky Downs

## 2020-03-09 NOTE — ED Provider Notes (Signed)
[facility] Emergency Department Provider Note   ____________________________________________    I have reviewed the triage vital signs and the nursing notes.   HISTORY  Chief Complaint Chest Pain     HPI Anita [name] is a 34 y.o. female with history as noted below who presents with complaints of right-sided chest discomfort which she notes has been ongoing for approximately 24 hours.  She does report a cough, does not think that she has had a fever but has had intermittent chills.  No sick contacts reported.  Had her first Covid shot 1 week ago.  No nausea or vomiting, intermittent headache but no myalgias.  No loss of taste or smell.  Past Medical History:  Diagnosis Date  . Anemia   . Asthma   . Fibroid uterus   . Heavy periods   . Pelvic peritoneal adhesions, female   . S/P ectopic pregnancy 12/19/2014   Left tubal pregnancy, excised.  . S/P tubal ligation     Patient Active Problem List   Diagnosis Date Noted  . Elevated blood pressure 05/10/2016  . Increased BMI 05/10/2016  . Status post hysterectomy with Lt oophorectomy 04/06/2016  . Endometriosis of ovary 04/04/2016  . Pelvic peritoneal adhesions, female 05/05/2015  . Status post ectopic pregnancy 05/05/2015    Past Surgical History:  Procedure Laterality Date  . BILATERAL SALPINGECTOMY  12/2014   s/p left ectopic pregnancy  . HYSTERECTOMY ABDOMINAL WITH SALPINGECTOMY Bilateral 04/04/2016   Procedure: HYSTERECTOMY ABDOMINAL ;  Surgeon: Brayton Mars, MD;  Location: [facility] ORS;  Service: Gynecology;  Laterality: Bilateral;  . OOPHORECTOMY Left 04/04/2016   Procedure: OOPHORECTOMY;  Surgeon: Brayton Mars, MD;  Location: [facility] ORS;  Service: Gynecology;  Laterality: Left;  . TUBAL LIGATION      Prior to Admission medications   Medication Sig Start Date End Date Taking? Authorizing Provider  amoxicillin (AMOXIL) 500 MG capsule Take 2 capsules (1,000 mg total) by mouth  3 (three) times daily for 7 days. 03/09/20 03/16/20  Lavonia Drafts, MD  azithromycin (ZITHROMAX) 500 MG tablet Take 1 tablet (500 mg total) by mouth daily for 7 days. Take 1 tablet daily for 3 days. 03/09/20 03/16/20  Lavonia Drafts, MD  butalbital-acetaminophen-caffeine (FIORICET) [phone] MG tablet Take 1 tablet by mouth every 4 (four) hours as needed for headache. 09/17/19   Hinda Kehr, MD  escitalopram (LEXAPRO) 10 MG tablet Take 10 mg by mouth daily.    [provider]  ibuprofen (ADVIL,MOTRIN) 600 MG tablet Take 1 tablet (600 mg total) by mouth every 8 (eight) hours as needed. 11/04/18   Darel Hong, MD  nitrofurantoin (MACRODANTIN) 100 MG capsule Take 100 mg by mouth 2 (two) times daily.    [provider]     Allergies Progestins  Family History  Problem Relation Age of Onset  . Liver cancer Mother   . Diabetes Paternal Grandmother   . Heart disease Neg Hx     Social History Social History   Tobacco Use  . Smoking status: Never Smoker  . Smokeless tobacco: Never Used  Substance Use Topics  . Alcohol use: Yes    Comment: occ.  . Drug use: No    Review of Systems  Constitutional: As above Eyes: No visual changes.  ENT: No sore throat. Cardiovascular: Aching chest discomfort on the right Respiratory: Positive cough Gastrointestinal: No abdominal pain.  No nausea, no vomiting.   Genitourinary: Negative for dysuria. Musculoskeletal: Negative for back pain. Skin:  Negative for rash. Neurological: Negative for weakness   ____________________________________________   PHYSICAL EXAM:  VITAL SIGNS: ED Triage Vitals  Enc Vitals Group     BP 03/09/20 0832 (!) 146/89     Pulse Rate 03/09/20 0832 62     Resp 03/09/20 0832 16     Temp 03/09/20 0832 98.5 F (36.9 C)     Temp Source 03/09/20 0832 Oral     SpO2 03/09/20 0832 100 %     Weight 03/09/20 0833 71.7 kg (158 lb 1.1 oz)     Height 03/09/20 0833 1.753 m (5\' 9" )     Head Circumference --        Peak Flow --      Pain Score 03/09/20 0832 9     Pain Loc --      Pain Edu? --      Excl. in [place]? --     Constitutional: Alert and oriented.   Nose: No congestion/rhinnorhea. Mouth/Throat: Mucous membranes are moist.    Cardiovascular: Normal rate, regular rhythm. Grossly normal heart sounds.  Good peripheral circulation. Respiratory: Normal respiratory effort.  No retractions. Lungs CTAB.  No wheezing Gastrointestinal: Soft and nontender. No distention.  No CVA tenderness.  Musculoskeletal: No lower extremity tenderness nor edema.  Warm and well perfused Neurologic:  Normal speech and language. No gross focal neurologic deficits are appreciated.  Skin:  Skin is warm, dry and intact. No rash noted. Psychiatric: Mood and affect are normal. Speech and behavior are normal.  ____________________________________________   LABS (all labs ordered are listed, but only abnormal results are displayed)  Labs Reviewed  BASIC METABOLIC PANEL - Abnormal; Notable for the following components:      Result Value   Potassium 3.2 (*)    Calcium 8.0 (*)    All other components within normal limits  CBC - Abnormal; Notable for the following components:   WBC 2.8 (*)    HCT 35.4 (*)    All other components within normal limits  SARS CORONAVIRUS 2 BY RT PCR (HOSPITAL ORDER, [facility])  POC URINE PREG, ED  TROPONIN I (HIGH SENSITIVITY)   ____________________________________________  EKG  ED ECG REPORT I, Lavonia Drafts, the attending physician, personally viewed and interpreted this ECG.  Date: 03/09/2020  Rhythm: normal sinus rhythm QRS Axis: normal Intervals: normal ST/T Wave abnormalities: normal Narrative Interpretation: no evidence of acute ischemia  ____________________________________________  RADIOLOGY  Chest x-ray reviewed by me, opacity noted right upper lobe ____________________________________________   PROCEDURES  Procedure(s)  performed: No  Procedures   Critical Care performed: No ____________________________________________   INITIAL IMPRESSION / ASSESSMENT AND PLAN / ED COURSE  Pertinent labs & imaging results that were available during my care of the patient were reviewed by me and considered in my medical decision making (see chart for details).  Patient presents with chest pain as described above, primarily in the right side.  No significant pleurisy.  Reports chills, cough.  Suspicious for pneumonia, or COVID-19 differential less likely ACS or myocarditis  Troponin is normal, EKG is normal.  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  White blood cell count mildly low she will require recheck of this.  Chest x-ray consistent with CAP, given amoxicillin and azithromycin here in the emergency department, outpatient follow-up with repeat chest x-ray recommended, return precautions discussed    ____________________________________________   FINAL CLINICAL IMPRESSION(S) / ED DIAGNOSES  Final diagnoses:  Community acquired pneumonia of  right upper lobe of lung        Note:  This document was prepared using Dragon voice recognition software and may include unintentional dictation errors.   Lavonia Drafts, MD 03/09/20 [id number]

## 2020-03-10 ENCOUNTER — Telehealth: Payer: Self-pay | Admitting: Unknown Physician Specialty

## 2020-03-10 NOTE — Telephone Encounter (Signed)
Called to discuss with Kadin M Bradway about Covid symptoms and the use of bamlanivimab, a monoclonal antibody infusion for those with mild to moderate Covid symptoms and at a high risk of hospitalization.     Pt is qualified for this infusion at the [facility] infusion center due to co-morbid conditions and/or a member of an at-risk group, however declines infusion at this time. Symptoms tier reviewed as well as criteria for ending isolation.  Symptoms reviewed that would warrant ED/Hospital evaluation. Preventative practices reviewed. Patient verbalized understanding. Patient advised to call back if he decides that he does want to get infusion. Callback number to the infusion center given. Patient advised to go to Urgent care or ED with severe symptoms. Last date ahw would be eligible for infusion is 5/31.   PSymptoms tier reviewed as well as criteria for ending isolation. Preventative practices reviewed. Patient verbalized understanding   Patient Active Problem List   Diagnosis Date Noted  . Elevated blood pressure 05/10/2016  . Increased BMI 05/10/2016  . Status post hysterectomy with Lt oophorectomy 04/06/2016  . Endometriosis of ovary 04/04/2016  . Pelvic peritoneal adhesions, female 05/05/2015  . Status post ectopic pregnancy 05/05/2015

## 2020-06-29 ENCOUNTER — Emergency Department: Payer: Self-pay

## 2020-06-29 ENCOUNTER — Other Ambulatory Visit: Payer: Self-pay

## 2020-06-29 ENCOUNTER — Emergency Department
Admission: EM | Admit: 2020-06-29 | Discharge: 2020-06-29 | Disposition: A | Discharge status: Home / Self Care | Payer: Self-pay | Attending: Emergency Medicine | Admitting: Emergency Medicine

## 2020-06-29 DIAGNOSIS — R0602 Shortness of breath: Secondary | ICD-10-CM | POA: Insufficient documentation

## 2020-06-29 DIAGNOSIS — Z5321 Procedure and treatment not carried out due to patient leaving prior to being seen by health care provider: Secondary | ICD-10-CM | POA: Insufficient documentation

## 2020-06-29 DIAGNOSIS — R079 Chest pain, unspecified: Secondary | ICD-10-CM | POA: Insufficient documentation

## 2020-06-29 LAB — BASIC METABOLIC PANEL
Anion gap: 7 (ref 5–15)
BUN: 24 mg/dL — ABNORMAL HIGH (ref 6–20)
CO2: 27 mmol/L (ref 22–32)
Calcium: 8.7 mg/dL — ABNORMAL LOW (ref 8.9–10.3)
Chloride: 102 mmol/L (ref 98–111)
Creatinine, Ser: 0.91 mg/dL (ref 0.44–1.00)
GFR calc Af Amer: >60 mL/min (ref >60)
GFR calc non Af Amer: >60 mL/min (ref >60)
Glucose, Bld: 88 mg/dL (ref 70–99)
Potassium: 3.8 mmol/L (ref 3.5–5.1)
Sodium: 136 mmol/L (ref 135–145)

## 2020-06-29 LAB — CBC
HCT: 36.3 % (ref 36.0–46.0)
Hemoglobin: 12.8 g/dL (ref 12.0–15.0)
MCH: 31 pg (ref 26.0–34.0)
MCHC: 35.3 g/dL (ref 30.0–36.0)
MCV: 87.9 fL (ref 80.0–100.0)
Platelets: 299 10*3/uL (ref 150–400)
RBC: 4.13 MIL/uL (ref 3.87–5.11)
RDW: 12.5 % (ref 11.5–15.5)
WBC: 7.2 10*3/uL (ref 4.0–10.5)
nRBC: 0 % (ref 0.0–0.2)

## 2020-06-29 LAB — TROPONIN I (HIGH SENSITIVITY): Troponin I (High Sensitivity): 3 ng/L (ref <18)

## 2020-06-29 NOTE — ED Notes (Signed)
Patient called 3 times (screener, this RN, and Xray tech), no answer. Not seen in bathroom or outside.

## 2020-06-29 NOTE — ED Triage Notes (Signed)
Pt states mid chest pain with shob that began gradually over last hour. . Pt denies fever. Pt appears in no acute distress.

## 2021-03-29 ENCOUNTER — Ambulatory Visit: Payer: Self-pay | Admitting: Physician Assistant

## 2021-03-29 ENCOUNTER — Encounter: Payer: Self-pay | Admitting: Physician Assistant

## 2021-03-29 ENCOUNTER — Other Ambulatory Visit: Payer: Self-pay

## 2021-03-29 DIAGNOSIS — Z113 Encounter for screening for infections with a predominantly sexual mode of transmission: Secondary | ICD-10-CM

## 2021-03-29 LAB — WET PREP FOR TRICH, YEAST, CLUE
Trichomonas Exam: NEGATIVE
Yeast Exam: NEGATIVE

## 2021-03-29 NOTE — Progress Notes (Signed)
  [facility] Department STI clinic/screening visit  Subjective:  Yoko ANNALIA METZGER is a 35 y.o. female being seen today for an STI screening visit. The patient reports they do not have symptoms.  Patient reports that they do not desire a pregnancy in the next year.   They reported they are not interested in discussing contraception today.  Patient's last menstrual period was 03/15/2016.   Patient has the following medical conditions:   Patient Active Problem List   Diagnosis Date Noted   Elevated blood pressure 05/10/2016   Increased BMI 05/10/2016   Status post hysterectomy with Lt oophorectomy 04/06/2016   Endometriosis of ovary 04/04/2016   Pelvic peritoneal adhesions, female 05/05/2015   Status post ectopic pregnancy 05/05/2015    Chief Complaint  Patient presents with   SEXUALLY TRANSMITTED DISEASE    screening    HPI  Patient reports that she is not having any symptoms but would like a screening today.  Reports that she does not have any chronic conditions and does not take any medicines regularly.  Reports that she had a hysterectomy in 2017 due to bleeding and fibroids.  States last HIV test was a year ago.  Last pap was in 2013, prior to her hysterectomy.   See flowsheet for further details and programmatic requirements.    The following portions of the patient's history were reviewed and updated as appropriate: allergies, current medications, past medical history, past social history, past surgical history and problem list.  Objective:  There were no vitals filed for this visit.  Physical Exam Constitutional:      General: She is not in acute distress.    Appearance: Normal appearance.  HENT:     Head: Normocephalic and atraumatic.  Eyes:     Conjunctiva/sclera: Conjunctivae normal.  Pulmonary:     Effort: Pulmonary effort is normal.  Skin:    General: Skin is warm and dry.  Neurological:     Mental Status: She is alert and oriented to person,  place, and time.  Psychiatric:        Mood and Affect: Mood normal.        Behavior: Behavior normal.        Thought Content: Thought content normal.        Judgment: Judgment normal.     Assessment and Plan:  Toye JANET HUMPHREYS is a 35 y.o. female presenting to the [facility] Department for STI screening  1. Screening for STD (sexually transmitted disease) Patient into clinic without symptoms. Patient declines pelvic exam by provider and opts to self-collect vaginal samples.  Counseled how to collect for accurate results. Reviewed with patient that her wet mount results are all normal and no treatment is indicated today. Rec condoms with all sex. Await test results.  Counseled that RN will call if needs to RTC for treatment once results are back.  - WET PREP FOR [place], [facility], [place] [facility] - Syphilis Serology, [facility]     No follow-ups on file.  No future appointments.  Jerene Dilling, PA

## 2021-11-05 ENCOUNTER — Encounter: Payer: Self-pay | Admitting: Emergency Medicine

## 2021-11-05 ENCOUNTER — Emergency Department
Admission: EM | Admit: 2021-11-05 | Discharge: 2021-11-05 | Disposition: A | Discharge status: Home / Self Care | Payer: Commercial Managed Care - PPO | Attending: Emergency Medicine | Admitting: Emergency Medicine

## 2021-11-05 ENCOUNTER — Other Ambulatory Visit: Payer: Self-pay

## 2021-11-05 ENCOUNTER — Emergency Department: Payer: Commercial Managed Care - PPO

## 2021-11-05 DIAGNOSIS — M25561 Pain in right knee: Secondary | ICD-10-CM | POA: Diagnosis present

## 2021-11-05 DIAGNOSIS — M179 Osteoarthritis of knee, unspecified: Secondary | ICD-10-CM

## 2021-11-05 DIAGNOSIS — M174 Other bilateral secondary osteoarthritis of knee: Secondary | ICD-10-CM | POA: Insufficient documentation

## 2021-11-05 MED ORDER — MELOXICAM 15 MG PO TABS: 15.0000 mg | ORAL_TABLET | Freq: Every day | ORAL | 0 refills | Status: AC

## 2021-11-05 NOTE — ED Triage Notes (Signed)
Pt in via POV, complaints of bilateral knee pain x a few months.  Denies any recent injury.  Ambulatory to triage, NAD noted at this time.

## 2021-11-05 NOTE — ED Provider Triage Note (Signed)
Emergency Medicine Provider Triage Evaluation Note  Anita [name], a 36 y.o. female  was evaluated in triage.  Pt complains of bilateral knee pain. She denies injury but notes intermittent pain with persistent standing and walking on her job. She denies trauma or chronic knee pain.    Review of Systems  Positive: Bilateral knee pain  Negative: CP  Physical Exam  LMP 03/15/2016  Gen:   Awake, no distress   Resp:  Normal effort  MSK:   Moves extremities without difficulty  Other:  CVS: RRR  Medical Decision Making  Medically screening exam initiated at 7:08 PM.  Appropriate orders placed.  Anita [name] was informed that the remainder of the evaluation will be completed by another provider, this initial triage assessment does not replace that evaluation, and the importance of remaining in the ED until their evaluation is complete.  Patient with ED evaluation of several months of bilateral knee pain.    Melvenia Needles, PA-C 11/05/21 [id number]

## 2021-11-05 NOTE — Discharge Instructions (Signed)
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  Advised to establish care with orthopedics for definitive evaluation and treatment.

## 2021-11-05 NOTE — ED Provider Notes (Signed)
[facility] Provider Note    Event Date/Time   First MD Initiated Contact with Patient 11/05/21 1915     (approximate)   History   Knee Pain   HPI Anita [name] is a 36 y.o. female patient presents with several months of bilateral knee pain.  Patient denies any provocative incident f except for prolonged standing walking was required for her job.  Patient rates her pain as a 6/10.  Patient described pain as "achy".  No palliative measure for complaint.  It is noted patient has a BMI of 36.48.   Physical Exam  No acute distress.  No obvious knee deformity edema or erythema.  No leg length discrepancy.  No guarding with palpation.  Patient has normal gait. Triage Vital Signs: ED Triage Vitals  Enc Vitals Group     BP 11/05/21 1908 108/66     Pulse Rate 11/05/21 1908 69     Resp 11/05/21 1908 15     Temp 11/05/21 1908 97.9 F (36.6 C)     Temp Source 11/05/21 1908 Oral     SpO2 11/05/21 1908 100 %     Weight 11/05/21 1909 247 lb (112 kg)     Height 11/05/21 1909 5\' 9"  (1.753 m)     Head Circumference --      Peak Flow --      Pain Score 11/05/21 1909 8     Pain Loc --      Pain Edu? --      Excl. in [place]? --     Most recent vital signs: Vitals:   11/05/21 1908 11/05/21 2039  BP: 108/66 120/88  Pulse: 69 72  Resp: 15 16  Temp: 97.9 F (36.6 C)   SpO2: 100% 99%    General: Awake, no distress.  CV:  Good peripheral perfusion.  Resp:  Normal effort.  Abd:  Distention secondary to body habitus.     ED Results / Procedures / Treatments   Labs (all labs ordered are listed, but only abnormal results are displayed) Labs Reviewed - No data to display   EKG     RADIOLOGY Review of patient bilateral knee x-ray shows tricompartmental generative changes.  This is confirmed by radiology report showing  no other acute findings.  PROCEDURES:  Critical Care performed: No  Procedures   MEDICATIONS ORDERED IN ED: Medications - No  data to display   IMPRESSION / MDM / [place] / ED COURSE  I reviewed the triage vital signs and the nursing notes. Patient presents with bilateral knee pain secondary to osteoarthritis.  Patient given discharge care instructions and a prescription for meloxicam.  Patient is advised to follow-up with orthopedic by calling for an appointment.                           Differential diagnosis includes, but is not limited to, infection, sprain, or degenerative change.        FINAL CLINICAL IMPRESSION(S) / ED DIAGNOSES   Final diagnoses:  Osteoarthritis of knee, unspecified laterality, unspecified osteoarthritis type     Rx / DC Orders   ED Discharge Orders          Ordered    meloxicam (MOBIC) 15 MG tablet  Daily        11/05/21 2032             Note:  This document was prepared using Dragon voice recognition  software and may include unintentional dictation errors.    Sable Feil, PA-C 11/05/21 2040    Carrie Mew, MD 11/05/21 [phone]

## 2021-12-25 ENCOUNTER — Emergency Department: Payer: Commercial Managed Care - PPO

## 2021-12-25 ENCOUNTER — Other Ambulatory Visit: Payer: Self-pay

## 2021-12-25 ENCOUNTER — Encounter: Payer: Self-pay | Admitting: Emergency Medicine

## 2021-12-25 ENCOUNTER — Emergency Department
Admission: EM | Admit: 2021-12-25 | Discharge: 2021-12-25 | Disposition: A | Discharge status: Home / Self Care | Payer: Commercial Managed Care - PPO | Attending: Emergency Medicine | Admitting: Emergency Medicine

## 2021-12-25 DIAGNOSIS — J45909 Unspecified asthma, uncomplicated: Secondary | ICD-10-CM | POA: Insufficient documentation

## 2021-12-25 DIAGNOSIS — M170 Bilateral primary osteoarthritis of knee: Secondary | ICD-10-CM | POA: Insufficient documentation

## 2021-12-25 NOTE — Discharge Instructions (Addendum)
You have been seen in the emergency room today for knee pain.  You will be discharged with the need to follow-up with [facility] on Monday.  You should take your meloxicam during the day to help with the pain. ?

## 2021-12-25 NOTE — ED Notes (Signed)
Pt with c/o pain in both knees, pt did get steroid injections but says they are not helping anymore. Pt states she has swelling and pain medicine makes her sleepy so she would like something during the day that does not make her drowsy.  ?

## 2021-12-25 NOTE — ED Triage Notes (Signed)
Pt via POV from home. Pt c/o bilateral knee pain. Pt states it has been going on for months and states it hurts worse when she walks. Pt is A&Ox4 and NAD.  ?

## 2021-12-25 NOTE — ED Provider Notes (Signed)
[facility] Emergency Department Provider Note   ____________________________________________   Event Date/Time   First MD Initiated Contact with Patient 12/25/21 1752     (approximate)  I have reviewed the triage vital signs and the nursing notes.   HISTORY  Chief Complaint Knee Pain    HPI Anita [name] is a 36 y.o. female patient is being seen in the emergency room for bilateral knee pain.   Patient reports that the left knee is actually the knee that she is being seen for today.  She states that over the past 48 hours the left knee has started to swell and hurt constantly.  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  She reports that when she gets home the pain is somewhat relieved by taking her meloxicam and sitting down.  Patient reports that she has not been taking meloxicam twice a day as it is ordered because it makes her sleepy during the day.  Patient states that she has not been taking any Tylenol or ibuprofen for the pain otherwise.  Patient reports that the pain can be anywhere from a 5 to a 10 out of 10 and is aching/throbbing in nature. She was seen in the emergency room on January 20 of this year for same complaint and found to have osteoarthritis of bilateral knees, she was referred to orthopedics.  She was seen by [facility] on February 13 of this year.  They reviewed with her options for nonsurgical and surgical for bilateral knee pain.  She opted to have steroid injections that were performed in the office that day.  She was to follow-up as needed.  Past Medical History:  Diagnosis Date   Anemia    Asthma    Fibroid uterus    Heavy periods    Pelvic peritoneal adhesions, female    S/P ectopic pregnancy 12/19/2014   Left tubal pregnancy, excised.   S/P tubal ligation     Patient Active Problem List   Diagnosis Date  Noted   Elevated blood pressure 05/10/2016   Increased BMI 05/10/2016   Status post hysterectomy with Lt oophorectomy 04/06/2016   Endometriosis of ovary 04/04/2016   Pelvic peritoneal adhesions, female 05/05/2015   Status post ectopic pregnancy 05/05/2015    Past Surgical History:  Procedure Laterality Date   BILATERAL SALPINGECTOMY  12/2014   s/p left ectopic pregnancy   HYSTERECTOMY ABDOMINAL WITH SALPINGECTOMY Bilateral 04/04/2016   Procedure: HYSTERECTOMY ABDOMINAL ;  Surgeon: Brayton Mars, MD;  Location: [facility] ORS;  Service: Gynecology;  Laterality: Bilateral;   OOPHORECTOMY Left 04/04/2016   Procedure: OOPHORECTOMY;  Surgeon: Brayton Mars, MD;  Location: [facility] ORS;  Service: Gynecology;  Laterality: Left;   TUBAL LIGATION      Prior to Admission medications   Medication Sig Start Date End Date Taking? Authorizing Provider  meloxicam (MOBIC) 15 MG tablet Take 1 tablet (15 mg total) by mouth daily. 11/05/21 11/05/22 Yes Sable Feil, PA-C  butalbital-acetaminophen-caffeine (FIORICET) [phone] MG tablet Take 1 tablet by mouth every 4 (four) hours as needed for headache. Patient not taking: Reported on 03/29/2021 09/17/19   Hinda Kehr, MD  escitalopram (LEXAPRO) 10 MG tablet Take 10 mg by mouth daily. Patient not taking: Reported on 03/29/2021    [provider]  ibuprofen (ADVIL,MOTRIN) 600 MG tablet Take 1 tablet (600 mg  total) by mouth every 8 (eight) hours as needed. Patient not taking: Reported on 03/29/2021 11/04/18   Darel Hong, MD  nitrofurantoin (MACRODANTIN) 100 MG capsule Take 100 mg by mouth 2 (two) times daily. Patient not taking: Reported on 03/29/2021    [provider]    Allergies Progestins  Family History  Problem Relation Age of Onset   Liver cancer Mother    Diabetes Paternal Grandmother    Heart disease Neg Hx     Social History Social History   Tobacco Use   Smoking status: Never   Smokeless tobacco: Never   Vaping Use   Vaping Use: Never used  Substance Use Topics   Alcohol use: Yes   Drug use: No    Review of Systems  Constitutional: No fever/chills Eyes: No visual changes. ENT: No sore throat. Cardiovascular: Denies chest pain. Respiratory: Denies shortness of breath. Gastrointestinal: No abdominal pain.  No nausea, no vomiting.  No diarrhea.  No constipation. Genitourinary: Negative for dysuria. Musculoskeletal: Negative for back pain.  Bilateral knee pain.  Pain is worse in the left knee. Skin: Negative for rash. Neurological: Negative for headaches, focal weakness or numbness.   ____________________________________________   PHYSICAL EXAM:  VITAL SIGNS: ED Triage Vitals  Enc Vitals Group     BP 12/25/21 1734 140/77     Pulse Rate 12/25/21 1734 63     Resp 12/25/21 1734 16     Temp 12/25/21 1734 98.5 F (36.9 C)     Temp Source 12/25/21 1734 Oral     SpO2 12/25/21 1734 100 %     Weight 12/25/21 1731 240 lb (108.9 kg)     Height 12/25/21 1731 '5\' 8"'$  (1.727 m)     Head Circumference --      Peak Flow --      Pain Score 12/25/21 1731 8     Pain Loc --      Pain Edu? --      Excl. in [place]? --     Constitutional: Alert and oriented. Well appearing and in no acute distress. Eyes: Conjunctivae are normal. PERRL. EOMI. Head: Atraumatic. Nose: No congestion/rhinnorhea. Mouth/Throat: Mucous membranes are moist.  Oropharynx non-erythematous. Neck: No stridor.   Cardiovascular: Normal rate, regular rhythm. Grossly normal heart sounds.  Good peripheral circulation. Respiratory: Normal respiratory effort.  No retractions. Lungs CTAB. Gastrointestinal: Soft and nontender. No distention. No abdominal bruits. No CVA tenderness. Musculoskeletal: No lower extremity tenderness nor edema.  No joint effusions.  Patient is noted to have minimal swelling to the left knee when compared with the right.  There is no redness or fluid accumulation noted.  Patient is tender to palpation her  for the distal knee.  Patient has normal gait. Neurologic:  Normal speech and language. No gross focal neurologic deficits are appreciated. No gait instability. Skin:  Skin is warm, dry and intact. No rash noted. Psychiatric: Mood and affect are normal. Speech and behavior are normal.  ____________________________________________   LABS (all labs ordered are listed, but only abnormal results are displayed)  Labs Reviewed - No data to display ____________________________________________  EKG   ____________________________________________  RADIOLOGY  ED MD interpretation: X-ray of left knee is reviewed by me and read by radiologist.  Left knee x-ray shows mild degenerative changes that are consistent with prior x-rays/diagnosis of osteoarthritis.  Official radiology report(s): DG Knee Complete 4 Views Left  Result Date: 12/25/2021 CLINICAL DATA:  Ongoing left knee pain.  No known injury. EXAM: LEFT KNEE -  COMPLETE 4+ VIEW COMPARISON:  11/05/2021 FINDINGS: Mild degenerative changes in the left knee with mild medial compartment narrowing and osteophyte formation in the medial and lateral compartments. No evidence of acute fracture or dislocation. No focal bone lesion or bone destruction. Bone cortex appears intact. No significant effusions. Soft tissues are unremarkable. No change since prior study. IMPRESSION: Mild degenerative change in the left knee. No change. No acute bony abnormalities. Electronically Signed   By: Lucienne Capers M.D.   On: 12/25/2021 19:14    ____________________________________________   PROCEDURES  Procedure(s) performed: None  Procedures  Critical Care performed: No  ____________________________________________   INITIAL IMPRESSION / ASSESSMENT AND PLAN / ED COURSE      Anita [name] is a 36 y.o. female patient is being seen in the emergency room for bilateral knee pain.   Patient reports that the left knee is actually the knee that she is  being seen for today.  She states that over the past 48 hours the left knee has started to swell and hurt constantly.  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  She reports that when she gets home the pain is somewhat relieved by taking her meloxicam and sitting down.  Patient reports that she has not been taking meloxicam twice a day because it makes her sleepy during the day.  However, it was only ordered once daily.  Patient states that she has not been taking any Tylenol or ibuprofen for the pain otherwise.  Patient reports that the pain can be anywhere from a 5 to a 10 out of 10 and is aching/throbbing in nature. She was seen in the emergency room on January 20 of this year for same complaint and found to have osteoarthritis of bilateral knees, she was referred to orthopedics.  She was seen by [facility] on February 13 of this year.  They reviewed with her options for nonsurgical and surgical for bilateral knee pain.  She opted to have steroid injections that were performed in the office that day.  She was to follow-up as needed.  Will obtain x-ray of the left knee. Left knee x-ray shows mild degenerative changes that are consistent with prior x-rays/diagnosis of osteoarthritis.  I will discharge patient home with instructions to follow-up with [facility] Monday morning for appointment and follow-up care. She is instructed to take her meloxicam during the day to help with pain symptoms while at work.  Also half the meloxicam and take half in the morning and half at night if she finds this brings her for symptom relief.  Patient will be discharged home in stable condition at this time.      ____________________________________________   FINAL CLINICAL IMPRESSION(S) / ED DIAGNOSES  Final diagnoses:  Osteoarthritis of both knees, unspecified osteoarthritis type     ED  Discharge Orders     None        Note:  This document was prepared using Dragon voice recognition software and may include unintentional dictation errors.     Willaim Rayas, NP 12/25/21 1926    Rada Hay, MD 12/26/21 [phone]

## 2022-06-11 ENCOUNTER — Encounter: Payer: Self-pay | Admitting: Emergency Medicine

## 2022-06-11 ENCOUNTER — Other Ambulatory Visit: Payer: Self-pay

## 2022-06-11 ENCOUNTER — Emergency Department
Admission: EM | Admit: 2022-06-11 | Discharge: 2022-06-11 | Disposition: A | Discharge status: Home / Self Care | Payer: Commercial Managed Care - PPO | Attending: Emergency Medicine | Admitting: Emergency Medicine

## 2022-06-11 DIAGNOSIS — R040 Epistaxis: Secondary | ICD-10-CM | POA: Diagnosis not present

## 2022-06-11 NOTE — ED Provider Triage Note (Signed)
Emergency Medicine Provider Triage Evaluation Note  Anita [name], a 36 y.o. female  was evaluated in triage.  Pt complains of nosebleeds. Patient presents with complaints of intermittent spontaneous nosebleeds, for the last week. She reports headache, but denies CP or SOB.  Review of Systems  Positive: nosebleeds Negative: CP, SOB  Physical Exam  BP 138/77 (BP Location: Left Arm)   Pulse 72   Temp 98.2 F (36.8 C) (Oral)   Resp 20   Ht '5\' 8"'$  (1.727 m)   Wt 108 kg   LMP 03/15/2016   SpO2 95%   BMI 36.20 kg/m  Gen:   Awake, no distress  NAD  Resp:  Normal effort CTA MSK:   Moves extremities without difficulty  CVS:  RRR  Medical Decision Making  Medically screening exam initiated at 4:24 PM.  Appropriate orders placed.  Anita [name] was informed that the remainder of the evaluation will be completed by another provider, this initial triage assessment does not replace that evaluation, and the importance of remaining in the ED until their evaluation is complete.  Patient with history of anemia and asthma, presents to the ED for intermittent nosebleeds for the last week.   Melvenia Needles, PA-C 06/11/22 1644

## 2022-06-11 NOTE — ED Provider Notes (Signed)
[facility] Provider Note  Patient Contact: 6:16 PM (approximate)   History   Epistaxis   HPI  Anita [name] is a 36 y.o. female presents to the emergency department with an episode of resolved epistaxis.  Patient states that she was at work today when she experienced an episode of epistaxis.  She states that episode lasted approximately 30 minutes.  She states that she has a history of blood transfusion and was concerned that she needed blood work.  She states that she has had approximately 3-4 episodes of epistaxis this week.  She states that her work environment is cold and dry.  She denies new medications or intranasal no sprays.  No other falls or mechanisms of trauma.      Physical Exam   Triage Vital Signs: ED Triage Vitals  Enc Vitals Group     BP 06/11/22 1619 138/77     Pulse Rate 06/11/22 1619 72     Resp 06/11/22 1619 20     Temp 06/11/22 1619 98.2 F (36.8 C)     Temp Source 06/11/22 1619 Oral     SpO2 06/11/22 1619 95 %     Weight 06/11/22 1616 238 lb 1.6 oz (108 kg)     Height 06/11/22 1616 5\' 8"  (1.727 m)     Head Circumference --      Peak Flow --      Pain Score 06/11/22 1616 7     Pain Loc --      Pain Edu? --      Excl. in [place]? --     Most recent vital signs: Vitals:   06/11/22 1619  BP: 138/77  Pulse: 72  Resp: 20  Temp: 98.2 F (36.8 C)  SpO2: 95%     General: Alert and in no acute distress. Eyes:  PERRL. EOMI. Head: No acute traumatic findings ENT:      Nose: No congestion/rhinnorhea.      Mouth/Throat: Mucous membranes are moist.  No active bleeding.  No nasal polyps visualized. Neck: No stridor. No cervical spine tenderness to palpation. Cardiovascular:  Good peripheral perfusion Respiratory: Normal respiratory effort without tachypnea or retractions. Lungs CTAB. Good air entry to the bases with no decreased or absent breath sounds. Gastrointestinal: Bowel sounds 4 quadrants. Soft and nontender to  palpation. No guarding or rigidity. No palpable masses. No distention. No CVA tenderness. Musculoskeletal: Full range of motion to all extremities.  Neurologic:  No gross focal neurologic deficits are appreciated.  Skin:   No rash noted    ED Results / Procedures / Treatments   Labs (all labs ordered are listed, but only abnormal results are displayed) Labs Reviewed - No data to display      PROCEDURES:  Critical Care performed: No  Procedures   MEDICATIONS ORDERED IN ED: Medications - No data to display   IMPRESSION / MDM / ASSESSMENT AND PLAN / ED COURSE  I reviewed the triage vital signs and the nursing notes.                             Assessment and plan:  Epistaxis 36 year old female presents to the emergency department after an episode of resolved epistaxis.  Vital signs are reassuring at triage.  On exam, patient was alert and nontoxic-appearing.  Patient had no active epistaxis on exam.  We obtained CBC and CMP given patient's history of transfusions and overall concern and  will reassess.  Patient declined CBC and CMP stating that she would like to be discharged.  Recommended follow-up with primary care.  All patient questions were answered.   FINAL CLINICAL IMPRESSION(S) / ED DIAGNOSES   Final diagnoses:  Epistaxis     Rx / DC Orders   ED Discharge Orders     None        Note:  This document was prepared using Dragon voice recognition software and may include unintentional dictation errors.   Pia Mau [place], PA-C 06/11/22 2054    Pilar Jarvis, MD 06/12/22 [phone]

## 2022-06-11 NOTE — ED Triage Notes (Signed)
Pt reports this week has had intermittent nosebleeds. Pt reports bled for 30 minutes today. Not currently bleeding. Pt also reports HA intermittently as well.

## 2022-09-01 IMAGING — DX DG KNEE COMPLETE 4+V*L*
5 series · 5 of 5 positions shown · non-contrast
Comparison: 11/05/2021

CLINICAL DATA: Ongoing left knee pain.  No known injury.

EXAM:
LEFT KNEE - COMPLETE 4+ VIEW

[knee ap]
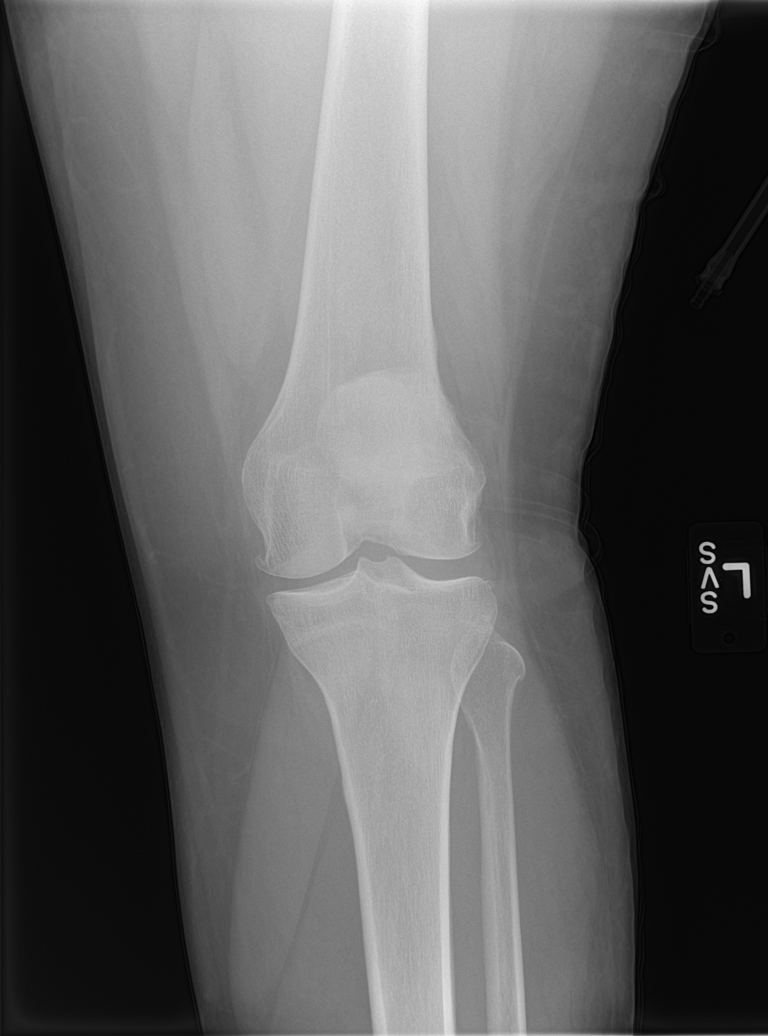

[knee tunnel]
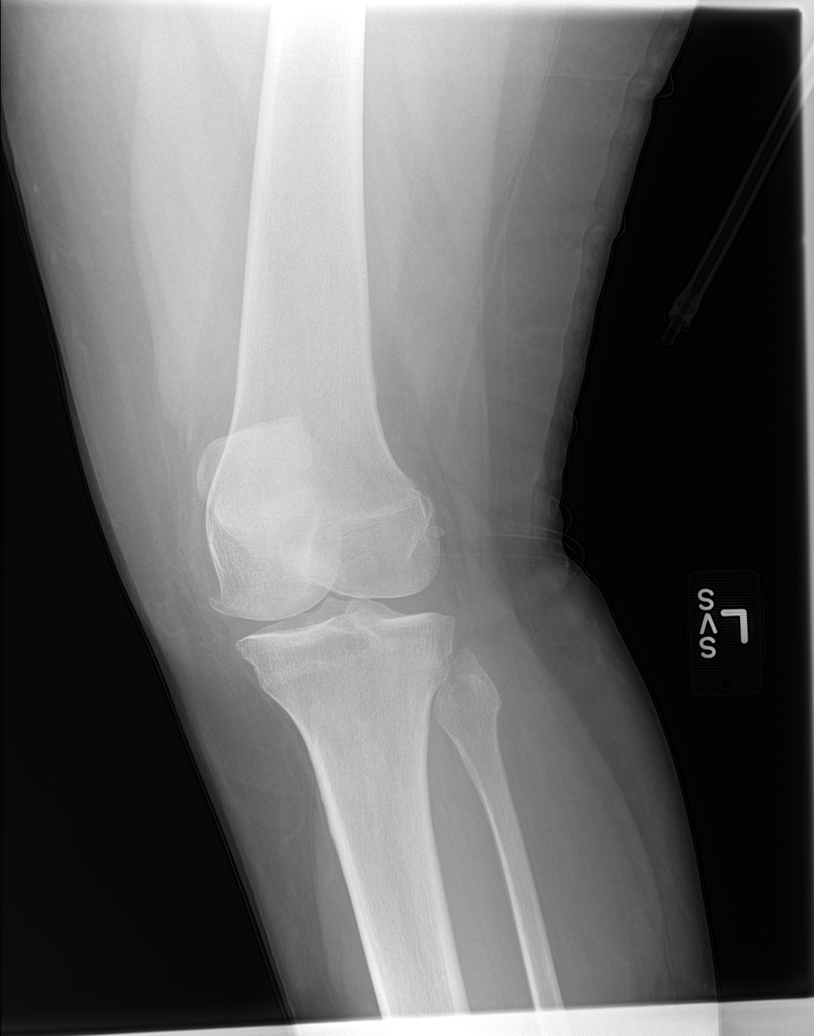

[knee lat]
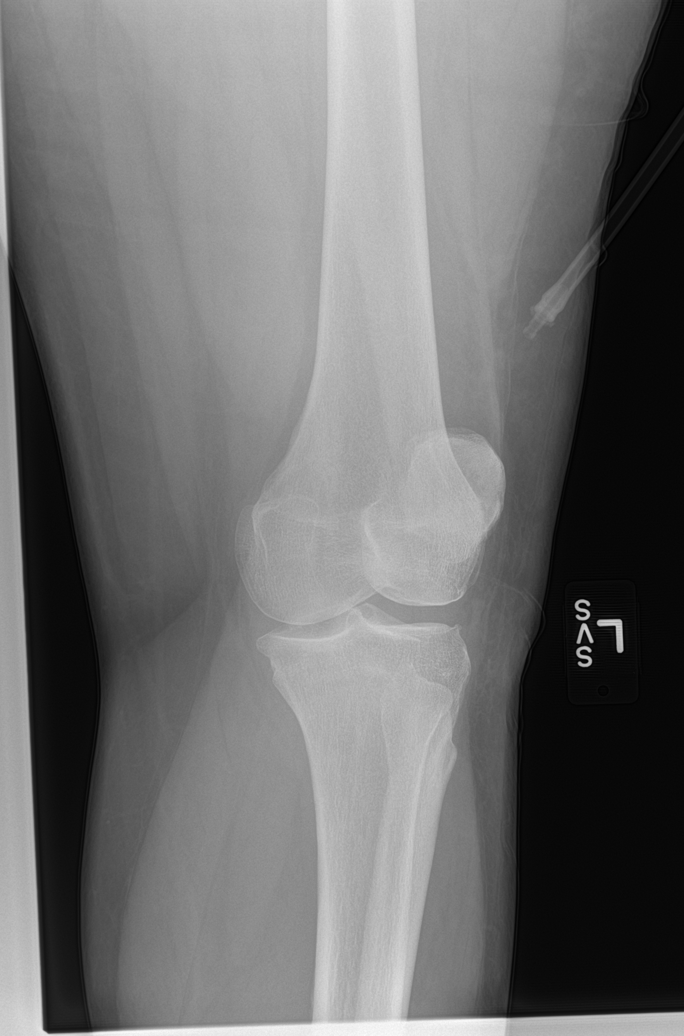

[patella skyline]
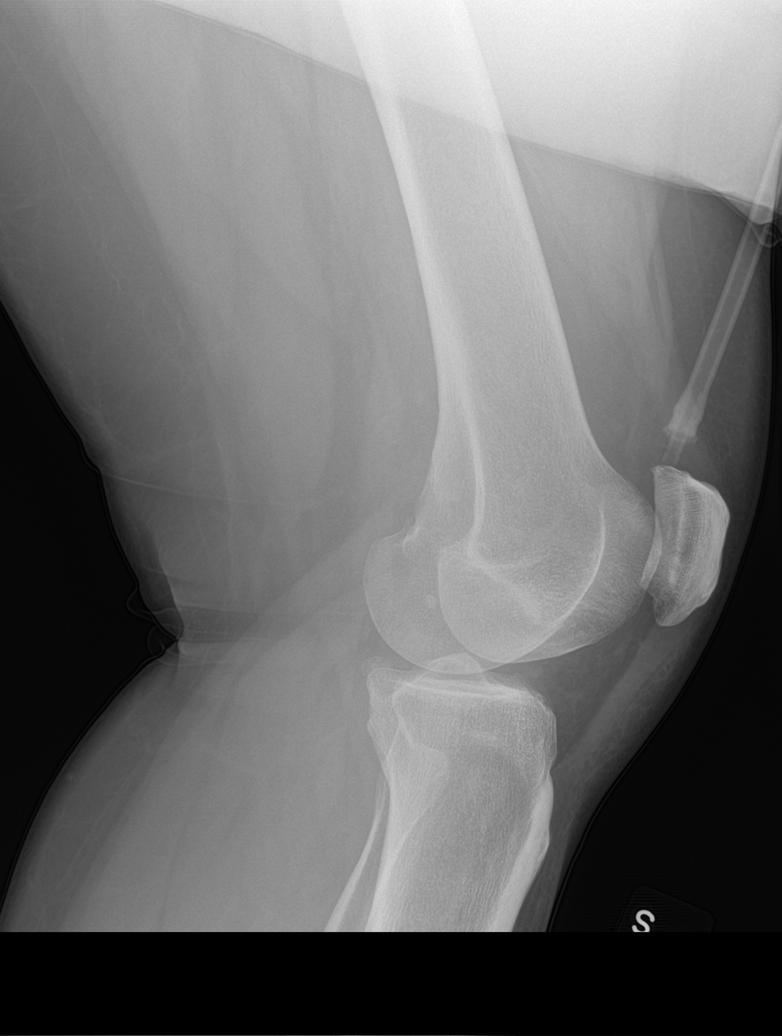

[knee obl]
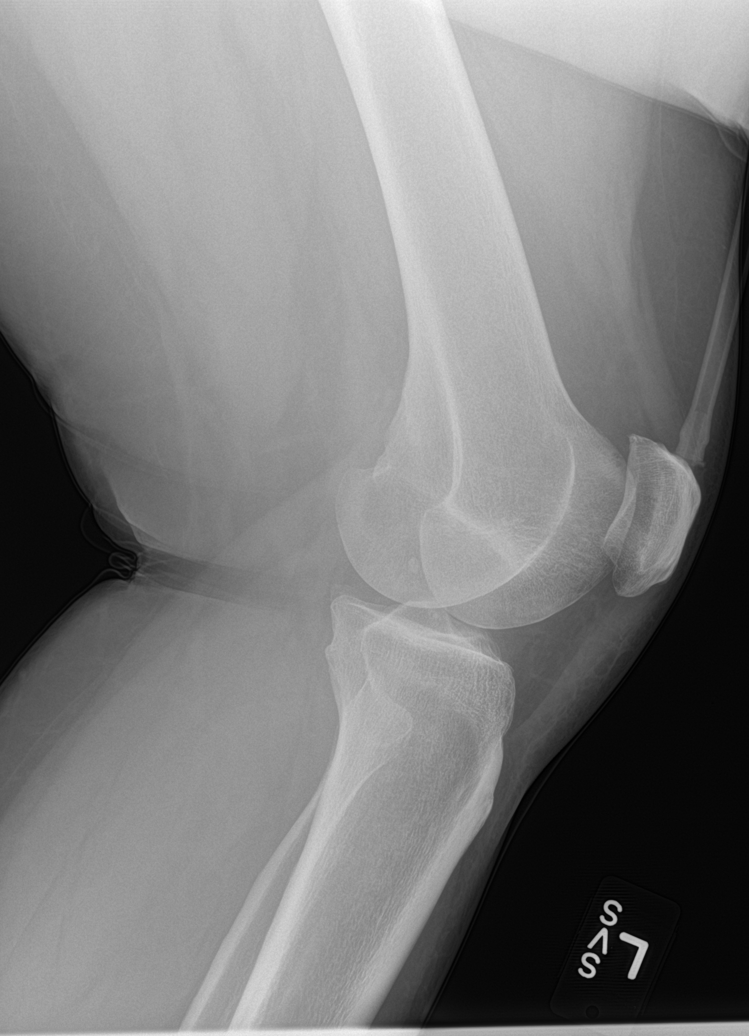

[5 of 5 positions shown; findings below may reference images not displayed]

FINDINGS: Mild degenerative changes in the left knee with mild medial
compartment narrowing and osteophyte formation in the medial and
lateral compartments. No evidence of acute fracture or dislocation.
No focal bone lesion or bone destruction. Bone cortex appears
intact. No significant effusions. Soft tissues are unremarkable. No
change since prior study.
IMPRESSION: Mild degenerative change in the left knee. No change. No acute bony
abnormalities.

## 2022-10-24 ENCOUNTER — Encounter: Payer: Self-pay | Admitting: Nurse Practitioner

## 2022-10-24 ENCOUNTER — Ambulatory Visit: Payer: No Typology Code available for payment source | Admitting: Nurse Practitioner

## 2022-10-24 ENCOUNTER — Ambulatory Visit: Payer: No Typology Code available for payment source

## 2022-10-24 DIAGNOSIS — Z113 Encounter for screening for infections with a predominantly sexual mode of transmission: Secondary | ICD-10-CM

## 2022-10-24 LAB — WET PREP FOR TRICH, YEAST, CLUE
Trichomonas Exam: NEGATIVE
Yeast Exam: NEGATIVE

## 2022-10-24 LAB — HM HEPATITIS C SCREENING LAB: HM Hepatitis Screen: NEGATIVE

## 2022-10-24 LAB — HEPATITIS B SURFACE ANTIGEN

## 2022-10-24 LAB — HM HIV SCREENING LAB: HM HIV Screening: NEGATIVE

## 2022-10-24 NOTE — Progress Notes (Signed)
[facility] Department  STI clinic/screening visit [place] [address] [phone]  Subjective:  Anita [name] is a 37 y.o. female being seen today for an STI screening visit. The patient reports they do not have symptoms.  Patient reports that they do not desire a pregnancy in the next year.   They reported they are not interested in discussing contraception today.  Patient has had a hysterectomy.    Patient's last menstrual period was 03/15/2016.   Patient has the following medical conditions:   Patient Active Problem List   Diagnosis Date Noted   Elevated blood pressure 05/10/2016   Increased BMI 05/10/2016   Status post hysterectomy with Lt oophorectomy 04/06/2016   Endometriosis of ovary 04/04/2016   Pelvic peritoneal adhesions, female 05/05/2015   Status post ectopic pregnancy 05/05/2015    Chief Complaint  Patient presents with   SEXUALLY TRANSMITTED DISEASE    Screening- patient states she is not having any symptoms     HPI  Patient reports to clinic today for STD screening.  Patient is currently asymptomatic.    Does the patient using douching products? No  Last HIV test per patient/review of record was No results found for: "HMHIVSCREEN"  Lab Results  Component Value Date   HIV NON REACTIVE 06/03/2018   Patient reports last pap was: greater than  6 years ago.   Screening for MPX risk: Does the patient have an unexplained rash? No Is the patient MSM? No Does the patient endorse multiple sex partners or anonymous sex partners? Yes Did the patient have close or sexual contact with a person diagnosed with MPX? No Has the patient traveled outside the [place] where MPX is endemic? No Is there a high clinical suspicion for MPX-- evidenced by one of the following No  -Unlikely to be chickenpox  -Lymphadenopathy  -Rash that present in same phase of evolution on any given body part See flowsheet for further details and  programmatic requirements.   Immunization history:   There is no immunization history on file for this patient.   The following portions of the patient's history were reviewed and updated as appropriate: allergies, current medications, past medical history, past social history, past surgical history and problem list.  Objective:  There were no vitals filed for this visit.  Physical Exam Constitutional:      Appearance: Normal appearance.  HENT:     Head: Normocephalic. No abrasion, masses or laceration. Hair is normal.     Right Ear: External ear normal.     Left Ear: External ear normal.     Nose: Nose normal.     Mouth/Throat:     Lips: Pink.     Mouth: Mucous membranes are moist. No oral lesions.     Pharynx: No oropharyngeal exudate or posterior oropharyngeal erythema.     Tonsils: No tonsillar exudate or tonsillar abscesses.     Comments: Poor dentition Eyes:     General: Lids are normal.        Right eye: No discharge.        Left eye: No discharge.     Conjunctiva/sclera: Conjunctivae normal.     Right eye: No exudate.    Left eye: No exudate. Pulmonary:     Effort: Pulmonary effort is normal.  Abdominal:     General: Abdomen is flat.     Palpations: Abdomen is soft.     Tenderness: There is no abdominal tenderness. There is no rebound.  Genitourinary:    Pubic Area: No rash or pubic lice.      Labia:        Right: No rash, tenderness, lesion or injury.        Left: No rash, tenderness, lesion or injury.      Vagina: Normal. No vaginal discharge, erythema or lesions.     Rectum: Normal.     Comments: Amount Discharge: small  Odor: No pH: less than 4.5 Adheres to vaginal wall: No Color: Anita [name]  Musculoskeletal:     Cervical back: Full passive range of motion without pain, normal range of motion and neck supple.  Lymphadenopathy:     Cervical: No cervical adenopathy.     Right cervical: No superficial, deep or posterior cervical adenopathy.    Left cervical:  No superficial, deep or posterior cervical adenopathy.     Upper Body:     Right upper body: No supraclavicular, axillary or epitrochlear adenopathy.     Left upper body: No supraclavicular, axillary or epitrochlear adenopathy.     Lower Body: No right inguinal adenopathy. No left inguinal adenopathy.  Skin:    General: Skin is warm and dry.     Findings: No lesion or rash.  Neurological:     Mental Status: She is alert and oriented to person, place, and time.  Psychiatric:        Attention and Perception: Attention normal.        Mood and Affect: Mood normal.        Speech: Speech normal.        Behavior: Behavior normal. Behavior is cooperative.      Assessment and Plan:  Anita [name] is a 37 y.o. female presenting to the [facility] Department for STI screening  1. Screening examination for venereal disease -37 year old female in clinic today for STD screening. -Patient accepted all screenings including vaginal CT/GC, wet prep and bloodwork for HIV/RPR.  Patient meets criteria for HepB screening? Yes. Ordered? Yes Patient meets criteria for HepC screening? Yes. Ordered? Yes  Treat wet prep per standing order Discussed time line for State Lab results and that patient will be called with positive results and encouraged patient to call if she had not heard in 2 weeks.  Counseled to return or seek care for continued or worsening symptoms Recommended condom use with all sex  Patient is currently using Sterilization for Men and Women to prevent pregnancy.     - HIV/HCV [place] [facility] - Syphilis Serology, [place] [facility] - HBV Antigen/Antibody [place] Lab - Chlamydia/Gonorrhea [place] [facility] - WET PREP FOR [place], YEAST, CLUE   Total time spent: 30 minutes   Return if symptoms worsen or fail to improve.   Gregary Cromer, FNP

## 2022-10-24 NOTE — Progress Notes (Signed)
Pt here for STI screening.  Wet mount results reviewed with pt.  Pt is asymptomatic at this time and no treatment indicated.  Condoms provided.-Forest Becker, RN

## 2023-01-01 ENCOUNTER — Other Ambulatory Visit: Payer: Self-pay

## 2023-01-01 DIAGNOSIS — M25561 Pain in right knee: Secondary | ICD-10-CM | POA: Diagnosis not present

## 2023-01-01 DIAGNOSIS — G8929 Other chronic pain: Secondary | ICD-10-CM | POA: Diagnosis not present

## 2023-01-01 DIAGNOSIS — M25562 Pain in left knee: Secondary | ICD-10-CM | POA: Diagnosis not present

## 2023-01-01 NOTE — ED Triage Notes (Addendum)
Pt to ED from home for bilateral knee pain x 3-4 months. Pt advised "I just have been putting it off". Pt is ambulatory in the lobby but placed in wheel chair. Pt is CAOx4 and in no acute distress.  Pt has been seen here before for the same. Pt was advised she needed surgery on her knees bc they were bone on bone. Pt works and stands on her feet all day.  Per patient chart this issue has been going on since January last year so she is 15 months into knee pain. She is taking prescribed medication at home too.

## 2023-01-02 ENCOUNTER — Emergency Department
Admission: EM | Admit: 2023-01-02 | Discharge: 2023-01-02 | Disposition: A | Discharge status: Home / Self Care | Payer: Commercial Managed Care - PPO | Attending: Emergency Medicine | Admitting: Emergency Medicine

## 2023-01-02 DIAGNOSIS — G8929 Other chronic pain: Secondary | ICD-10-CM

## 2023-01-02 MED ORDER — MELOXICAM 15 MG PO TABS: 15.0000 mg | ORAL_TABLET | Freq: Every day | ORAL | 0 refills | Status: AC

## 2023-01-02 NOTE — ED Provider Notes (Signed)
[facility] Provider Note    Event Date/Time   First MD Initiated Contact with Patient 01/02/23 0140     (approximate)   History   Knee Pain (Bilateral x x3 months)   HPI  Anita [name] is a 37 y.o. female with chronic bilateral knee pain and obesity.  She presents for evaluation of persistent knee pain.  She has been to the emergency department several times and has been to [facility] multiple times.  She has had joint injections in her knees in the past.  She was told she has "bone-on-bone" contact in both knees but is not a good candidate for knee replacement at this time.  She says she has not followed up recently but just wonders if there is anything else that could be done.  Meloxicam has worked for her in the past.  She does not use any kind of knee brace.  She has not had any recent falls or injuries.     Physical Exam   Triage Vital Signs: ED Triage Vitals  Enc Vitals Group     BP 01/01/23 2125 138/87     Pulse Rate 01/01/23 2125 72     Resp 01/01/23 2125 16     Temp 01/01/23 2125 98.8 F (37.1 C)     Temp Source 01/01/23 2125 Oral     SpO2 01/01/23 2125 100 %     Weight 01/01/23 2126 77.1 kg (170 lb)     Height 01/01/23 2126 1.727 m (5\' 8" )     Head Circumference --      Peak Flow --      Pain Score 01/01/23 2131 8     Pain Loc --      Pain Edu? --      Excl. in [place]? --     Most recent vital signs: Vitals:   01/01/23 2125 01/02/23 0247  BP: 138/87 128/78  Pulse: 72 68  Resp: 16 16  Temp: 98.8 F (37.1 C)   SpO2: 100% 100%     General: Awake, no distress.  CV:  Good peripheral perfusion.  Resp:  Normal effort. Speaking easily and comfortably, no accessory muscle usage nor intercostal retractions.   Abd:  No distention.  Obese. Other:  No obvious effusions.  Normal range of motion and the patient is ambulatory.  She reports mild tenderness when I palpate her knees but I do not appreciate any gross deformities or  abnormalities.   ED Results / Procedures / Treatments   Labs (all labs ordered are listed, but only abnormal results are displayed) Labs Reviewed - No data to display      PROCEDURES:  Critical Care performed: No  Procedures   MEDICATIONS ORDERED IN ED: Medications - No data to display   IMPRESSION / MDM / [place] / ED COURSE  I reviewed the triage vital signs and the nursing notes.                              Differential diagnosis includes, but is not limited to, chronic knee pain, arthritis, less likely bursitis or septic joint.  Patient's presentation is most consistent with exacerbation of chronic illness.  Vital signs are stable and within normal limits.  Symptoms/issues are chronic.  I had a long talk with her about her prior visits Ortho and recommended she follow-up with him again.  I gave her another prescription for meloxicam and  I recommended trying knee braces to see if it helps her get through her day-to-day activities.  I also gave her the name and number of Dr. Sharlet Salina.  She understands and agrees with the plan for outpatient follow-up.         FINAL CLINICAL IMPRESSION(S) / ED DIAGNOSES   Final diagnoses:  Chronic pain of both knees     Rx / DC Orders   ED Discharge Orders          Ordered    meloxicam (MOBIC) 15 MG tablet  Daily        01/02/23 0232             Note:  This document was prepared using Dragon voice recognition software and may include unintentional dictation errors.   Hinda Kehr, MD 01/02/23 [phone]

## 2023-01-02 NOTE — Discharge Instructions (Signed)
Please consider using the knee braces we suggested to see if it helps you.  You can take the meloxicam but do so cautiously because you do not want to be on it long-term.  You can also supplement this with over-the-counter Tylenol, up to 2 extra strength tablets 4 times a day.  Follow-up with your orthopedic surgeon at the next available opportunity, or with Dr. Sharlet Salina, who may also be able to help.

## 2023-11-14 ENCOUNTER — Ambulatory Visit: Payer: Commercial Managed Care - PPO | Admitting: Family Medicine

## 2023-11-14 ENCOUNTER — Ambulatory Visit: Payer: Commercial Managed Care - PPO

## 2023-11-14 DIAGNOSIS — Z113 Encounter for screening for infections with a predominantly sexual mode of transmission: Secondary | ICD-10-CM

## 2023-11-14 LAB — WET PREP FOR TRICH, YEAST, CLUE
Trichomonas Exam: NEGATIVE
Yeast Exam: NEGATIVE

## 2023-11-14 LAB — HM HIV SCREENING LAB: HM HIV Screening: NEGATIVE

## 2023-11-14 NOTE — Progress Notes (Signed)
Pt here for STI screening.  Wet mount results reviewed with patient.  No treatment needed at this time.  Condoms declined.-Collins Scotland, RN

## 2023-11-14 NOTE — Progress Notes (Signed)
[facility] Department STI clinic [name] [place] [address] Main phone: [phone]  STI screening visit  Subjective:  Anita [name] is a 38 y.o. female being seen today for an STI screening visit. The patient reports they do not have symptoms.  Patient reports that they do not desire a pregnancy in the next year.   They reported they are not interested in discussing contraception today.    Patient's last menstrual period was 03/15/2016.  Patient has the following medical conditions:  Patient Active Problem List   Diagnosis Date Noted   Elevated blood pressure 05/10/2016   Increased BMI 05/10/2016   Status post hysterectomy with Lt oophorectomy 04/06/2016   Endometriosis of ovary 04/04/2016   Pelvic peritoneal adhesions, female 05/05/2015   Status post ectopic pregnancy 05/05/2015    Chief Complaint  Patient presents with   SEXUALLY TRANSMITTED DISEASE    Pt is here for STD screening and has no symptoms    HPI HPI Patient reports to clinic for screening- denies symptoms  Does the patient using douching products? No  Last HIV test per patient/review of record was  Lab Results  Component Value Date   HMHIVSCREEN Negative - Validated 10/24/2022    Lab Results  Component Value Date   HIV NON REACTIVE 06/03/2018     Last HEPC test per patient/review of record was  Lab Results  Component Value Date   HMHEPCSCREEN Negative-Validated 10/24/2022   No components found for: "HEPC"   Last HEPB test per patient/review of record was No components found for: "HMHEPBSCREEN" No components found for: "HEPC"   Patient reports last pap was: 2017  No results found for: "DIAGPAP", "HPVHIGH", "ADEQPAP" No results found for: "SPECADGYN" Result Date Procedure Results Follow-ups  04/04/2016 Surgical pathology SURGICAL PATHOLOGY: Surgical Pathology CASE: [id number] PATIENT: Anita [name] Surgical Pathology Report     SPECIMEN  SUBMITTED: A. Uterus with cervix, left ovary  CLINICAL HISTORY: None provided  PRE-OPERATIVE DIAGNOSIS: Menorrhagia, anemia, uterine lei...   10/19/2011 HM PAP SMEAR HM Pap smear: normal     Screening for MPX risk: Does the patient have an unexplained rash? No Is the patient MSM? No Does the patient endorse multiple sex partners or anonymous sex partners? No Did the patient have close or sexual contact with a person diagnosed with MPX? No Has the patient traveled outside the [place] where MPX is endemic? No Is there a high clinical suspicion for MPX-- evidenced by one of the following No  -Unlikely to be chickenpox  -Lymphadenopathy  -Rash that present in same phase of evolution on any given body part See flowsheet for further details and programmatic requirements.   Immunization history:   There is no immunization history on file for this patient.   The following portions of the patient's history were reviewed and updated as appropriate: allergies, current medications, past medical history, past social history, past surgical history and problem list.  Objective:  There were no vitals filed for this visit.  Physical Exam Vitals and nursing note reviewed. Exam conducted with a chaperone present Anita Fanning B RN).  Constitutional:      Appearance: Normal appearance.  HENT:     Head: Normocephalic and atraumatic.     Mouth/Throat:     Mouth: Mucous membranes are moist.     Pharynx: Oropharynx is clear. No oropharyngeal exudate or posterior oropharyngeal erythema.  Pulmonary:     Effort: Pulmonary effort is normal.  Abdominal:     General:  Abdomen is flat.     Palpations: There is no mass.     Tenderness: There is no abdominal tenderness. There is no rebound.  Genitourinary:    General: Normal vulva.     Exam position: Lithotomy position.     Pubic Area: No rash or pubic lice.      Labia:        Right: No rash or lesion.        Left: No rash or lesion.      Vagina: Vaginal  discharge present. No erythema, bleeding or lesions.     Cervix: No cervical motion tenderness, discharge, friability, lesion or erythema.     Uterus: Normal.      Adnexa: Right adnexa normal and left adnexa normal.     Rectum: Normal.     Comments: pH = 4  Small amt of white discharge present Lymphadenopathy:     Head:     Right side of head: No preauricular or posterior auricular adenopathy.     Left side of head: No preauricular or posterior auricular adenopathy.     Cervical: No cervical adenopathy.     Upper Body:     Right upper body: No supraclavicular, axillary or epitrochlear adenopathy.     Left upper body: No supraclavicular, axillary or epitrochlear adenopathy.     Lower Body: No right inguinal adenopathy. No left inguinal adenopathy.  Skin:    General: Skin is warm and dry.     Findings: No rash.  Neurological:     Mental Status: She is alert and oriented to person, place, and time.     Assessment and Plan:  Kaylany ANSLEY MANGIAPANE is a 38 y.o. female presenting to the [facility] Department for STI screening  1. Screening for venereal disease (Primary)  - Chlamydia/Gonorrhea [place] [facility] - HIV [place] [facility] - Syphilis Serology, [place] [facility] - WET PREP FOR TRICH, YEAST, CLUE   Patient accepted all screenings including vaginal CT/GC and bloodwork for HIV/RPR, and wet prep. Patient meets criteria for HepB screening? No. Ordered? not applicable Patient meets criteria for HepC screening? No. Ordered? not applicable  Treat wet prep per standing order Discussed time line for State Lab results and that patient will be called with positive results and encouraged patient to call if she had not heard in 2 weeks.  Counseled to return or seek care for continued or worsening symptoms Recommended repeat testing in 3 months with positive results. Recommended condom use with all sex for STI prevention.   Patient is currently using Sterilization by Laparoscopy to prevent  pregnancy.    No follow-ups on file.  No future appointments.  Lenice Llamas, [place]

## 2024-02-06 ENCOUNTER — Encounter: Payer: Self-pay | Admitting: Nurse Practitioner

## 2024-02-06 ENCOUNTER — Ambulatory Visit: Admitting: Nurse Practitioner

## 2024-02-06 DIAGNOSIS — Z113 Encounter for screening for infections with a predominantly sexual mode of transmission: Secondary | ICD-10-CM

## 2024-02-06 LAB — HM HEPATITIS C SCREENING LAB: HM Hepatitis Screen: NEGATIVE

## 2024-02-06 LAB — WET PREP FOR TRICH, YEAST, CLUE
Trichomonas Exam: NEGATIVE
Yeast Exam: NEGATIVE

## 2024-02-06 LAB — HEPATITIS B SURFACE ANTIGEN

## 2024-02-06 LAB — HM HIV SCREENING LAB: HM HIV Screening: NEGATIVE

## 2024-02-06 NOTE — Progress Notes (Signed)
 [facility] Department STI clinic [address] [place] [address] Main phone: [phone]  STI screening visit  Subjective:  Anita [name] is a 38 y.o. female being seen today for an STI screening visit. The patient reports they do not have symptoms.  Patient reports that they do not desire a pregnancy in the next year.   They reported they are not interested in discussing contraception today.    Patient's last menstrual period was 03/15/2016.  Patient has the following medical conditions:  Patient Active Problem List   Diagnosis Date Noted   Elevated blood pressure 05/10/2016   Increased BMI 05/10/2016   Status post hysterectomy with Lt oophorectomy 04/06/2016   Endometriosis of ovary 04/04/2016   Pelvic peritoneal adhesions, female 05/05/2015   Status post ectopic pregnancy 05/05/2015   Chief Complaint  Patient presents with   SEXUALLY TRANSMITTED DISEASE   Patient is a pleasant 38 y.o. female who presents to the office today requesting asymptomatic STI testing. Patient indicates 43 female partners in the last 2 months. She reports practicing vaginal sex and uses condoms sometimes. Patient indicates STI history of gonorrhea "a long time ago." Patient reports last sex was about three weeks ago. She indicates hysterectomy as contraception method and does not have periods.    Last HIV test per patient/review of record was  Lab Results  Component Value Date   HMHIVSCREEN Negative - Validated 11/14/2023    Lab Results  Component Value Date   HIV NON REACTIVE 06/03/2018     Last HEPC test per patient/review of record was  Lab Results  Component Value Date   HMHEPCSCREEN Negative-Validated 10/24/2022   No components found for: "HEPC"   Last HEPB test per patient/review of record was No components found for: "HMHEPBSCREEN"   Patient reports last pap was:    Result Date Procedure Results Follow-ups  04/04/2016 Surgical pathology  SURGICAL PATHOLOGY: Surgical Pathology CASE: [id number] PATIENT: Anita [name] Surgical Pathology Report     SPECIMEN SUBMITTED: A. Uterus with cervix, left ovary  CLINICAL HISTORY: None provided  PRE-OPERATIVE DIAGNOSIS: Menorrhagia, anemia, uterine lei...   10/19/2011 HM PAP SMEAR HM Pap smear: normal     Screening for MPX risk: Does the patient have an unexplained rash? No Is the patient MSM? No Does the patient endorse multiple sex partners or anonymous sex partners? Yes Did the patient have close or sexual contact with a person diagnosed with MPX? No Has the patient traveled outside the [place]  where MPX is endemic? No Is there a high clinical suspicion for MPX-- evidenced by one of the following No  -Unlikely to be chickenpox  -Lymphadenopathy  -Rash that present in same phase of evolution on any given body part See flowsheet for further details and programmatic requirements.   Immunization history:   There is no immunization history on file for this patient.   The following portions of the patient's history were reviewed and updated as appropriate: allergies, current medications, past medical history, past social history, past surgical history and problem list.  Objective:  There were no vitals filed for this visit.  Physical Exam Nursing note reviewed. Chaperone present: Declined.  Constitutional:      Appearance: Normal appearance.  HENT:     Head: Normocephalic.     Salivary Glands: Right salivary gland is not diffusely enlarged or tender. Left salivary gland is not diffusely enlarged or tender.     Mouth/Throat:     Lips: Pink. No lesions.  Mouth: Mucous membranes are moist.     Tongue: No lesions. Tongue does not deviate from midline.     Pharynx: Oropharynx is clear. Uvula midline.     Tonsils: No tonsillar exudate.  Eyes:     General:        Right eye: No discharge.        Left eye: No discharge.  Pulmonary:     Effort: Pulmonary effort is  normal.  Genitourinary:    General: Normal vulva.     Exam position: Lithotomy position.     Pubic Area: No rash or pubic lice.      Tanner stage (genital): 5.     Labia:        Right: No rash, tenderness, lesion or injury.        Left: No rash, tenderness, lesion or injury.      Vagina: Normal. No vaginal discharge, erythema, tenderness, bleeding or lesions.     Cervix: Normal. No cervical motion tenderness, discharge, friability, lesion, erythema, cervical bleeding or eversion.     Uterus: Normal.      Adnexa: Right adnexa normal and left adnexa normal.     Comments: pH<4.5 Lymphadenopathy:     Head:     Right side of head: No submental, submandibular, tonsillar, preauricular or posterior auricular adenopathy.     Left side of head: No submental, submandibular, tonsillar, preauricular or posterior auricular adenopathy.     Cervical: No cervical adenopathy.     Right cervical: No superficial or posterior cervical adenopathy.    Left cervical: No superficial or posterior cervical adenopathy.     Upper Body:     Right upper body: No supraclavicular or axillary adenopathy.     Left upper body: No supraclavicular or axillary adenopathy.     Lower Body: No right inguinal adenopathy. No left inguinal adenopathy.  Skin:    General: Skin is warm and dry.     Findings: No lesion or rash.     Comments: Skin tone appropriate for ethnicity.   Neurological:     Mental Status: She is alert and oriented to person, place, and time.  Psychiatric:        Attention and Perception: Attention and perception normal.        Mood and Affect: Mood normal. Affect is tearful.        Speech: Speech normal.        Behavior: Behavior normal. Behavior is cooperative.        Thought Content: Thought content normal.     Assessment and Plan:  Anita [name] is a 38 y.o. female presenting to the [facility] Department for STI screening  1. Screening for venereal disease (Primary)  -  Chlamydia/Gonorrhea [place] [facility] - HBV Antigen/Antibody [place] [facility] - HIV/HCV [place] [facility] - WET PREP FOR TRICH, YEAST, CLUE - Syphilis Serology, [place] [facility]  Of note, patient stated her mother had passed away and became tearful during exam. She accepted Nyle Belling, LCSW business card and said she would consider contacting her for an appointment. Patient declined referral.   Patient accepted screenings including vaginal CT/GC and bloodwork for HIV/RPR, and wet prep. Patient meets criteria for HepB screening? Yes. Ordered? yes Patient meets criteria for HepC screening? Yes. Ordered? yes  Treat wet prep per standing order Discussed time line for State Lab results and that patient will be called with positive results and encouraged patient to call if she had not heard in 2  weeks.  Counseled to return or seek care for continued or worsening symptoms Recommended repeat testing in 3 months with positive results. Recommended condom use with all sex for STI prevention.   Patient is currently using Sterilization for Men and Women to prevent pregnancy.    Return if symptoms worsen or fail to improve.  No future appointments.  Total time with patient 30 minutes.   Merleen Stare, NP

## 2024-02-06 NOTE — Progress Notes (Signed)
 Pt is here for STD screening. Wet prep results reviewed with pt, no treatment required per standing order. Condoms Given.Sonda Primes, RN.

## 2024-02-19 ENCOUNTER — Other Ambulatory Visit: Payer: Self-pay | Admitting: Family Medicine

## 2024-02-19 DIAGNOSIS — Z1231 Encounter for screening mammogram for malignant neoplasm of breast: Secondary | ICD-10-CM

## 2024-02-19 DIAGNOSIS — R519 Headache, unspecified: Secondary | ICD-10-CM

## 2024-02-26 ENCOUNTER — Ambulatory Visit
Admission: RE | Admit: 2024-02-26 | Discharge: 2024-02-26 | Disposition: A | Discharge status: Home / Self Care | Source: Ambulatory Visit | Attending: Family Medicine | Admitting: Family Medicine

## 2024-02-26 DIAGNOSIS — R519 Headache, unspecified: Secondary | ICD-10-CM | POA: Insufficient documentation

## 2024-04-18 ENCOUNTER — Other Ambulatory Visit: Payer: Self-pay

## 2024-04-18 ENCOUNTER — Emergency Department

## 2024-04-18 ENCOUNTER — Emergency Department
Admission: EM | Admit: 2024-04-18 | Discharge: 2024-04-18 | Disposition: A | Discharge status: Home / Self Care | Attending: Emergency Medicine | Admitting: Emergency Medicine

## 2024-04-18 DIAGNOSIS — M25562 Pain in left knee: Secondary | ICD-10-CM | POA: Diagnosis not present

## 2024-04-18 DIAGNOSIS — M25561 Pain in right knee: Secondary | ICD-10-CM | POA: Insufficient documentation

## 2024-04-18 DIAGNOSIS — G8929 Other chronic pain: Secondary | ICD-10-CM | POA: Insufficient documentation

## 2024-04-18 MED ORDER — DEXAMETHASONE SODIUM PHOSPHATE 10 MG/ML IJ SOLN
10.0000 mg | Freq: Once | INTRAMUSCULAR | Status: AC
  Administered 2024-04-18: 10 mg via INTRAMUSCULAR
  Filled 2024-04-18: qty 1

## 2024-04-18 MED ORDER — KETOROLAC TROMETHAMINE 30 MG/ML IJ SOLN
30.0000 mg | Freq: Once | INTRAMUSCULAR | Status: AC
  Administered 2024-04-18: 30 mg via INTRAMUSCULAR
  Filled 2024-04-18: qty 1

## 2024-04-18 MED ORDER — NAPROXEN 500 MG PO TABS
500.0000 mg | ORAL_TABLET | Freq: Two times a day (BID) | ORAL | 2 refills | Status: AC
Start: 2024-04-18 — End: 2025-04-18

## 2024-04-18 MED ORDER — HYDROCODONE-ACETAMINOPHEN 5-325 MG PO TABS
1.0000 | ORAL_TABLET | Freq: Once | ORAL | Status: AC
  Administered 2024-04-18: 1 via ORAL
  Filled 2024-04-18: qty 1

## 2024-04-18 MED ORDER — PREDNISONE 10 MG PO TABS: 10.0000 mg | ORAL_TABLET | Freq: Every day | ORAL | 0 refills | Status: AC

## 2024-04-18 NOTE — ED Provider Notes (Signed)
 [facility] Emergency Department Provider Note     Event Date/Time   First MD Initiated Contact with Patient 04/18/24 1633     (approximate)   History   No chief complaint on file.   HPI  Anita [name] is a 38 y.o. female with chronic bilateral knee pain and obesity presents to the ED for evaluation of acute on chronic right knee pain ongoing for a couple weeks now per patient.  Patient reports she has bone-on-bone contact of both knees but today noticed right knee swelling.  She reports using meloxicam  with minimal relief but the pain persists.  She has been evaluated by orthopedics who have declined any surgical intervention at this time.  She denies any new injuries, falls, or fevers.  She has been wearing a knee brace to offer support.    Physical Exam   Triage Vital Signs: ED Triage Vitals  Encounter Vitals Group     BP 04/18/24 1615 132/83     Girls Systolic BP Percentile --      Girls Diastolic BP Percentile --      Boys Systolic BP Percentile --      Boys Diastolic BP Percentile --      Pulse Rate 04/18/24 1615 78     Resp 04/18/24 1615 18     Temp 04/18/24 1615 99 F (37.2 C)     Temp Source 04/18/24 1615 Oral     SpO2 04/18/24 1615 99 %     Weight 04/18/24 1613 169 lb 15.6 oz (77.1 kg)     Height --      Head Circumference --      Peak Flow --      Pain Score 04/18/24 1613 10     Pain Loc --      Pain Education --      Exclude from Growth Chart --     Most recent vital signs: Vitals:   04/18/24 1615  BP: 132/83  Pulse: 78  Resp: 18  Temp: 99 F (37.2 C)  SpO2: 99%    General Awake, no distress.  HEENT NCAT.  CV:  Good peripheral perfusion.  RESP:  Normal effort.  ABD:  No distention.  Other:  Right knee reveals mild swelling.  No redness or warmth.  Tenderness to palpation over quadricep and patella ligament.  F ROM with extension and flexion without difficulty.  Negative valgus and varus test.   ED Results /  Procedures / Treatments   Labs (all labs ordered are listed, but only abnormal results are displayed) Labs Reviewed - No data to display  RADIOLOGY  I personally viewed and evaluated these images as part of my medical decision making, as well as reviewing the written report by the radiologist.  ED Provider Interpretation: No acute bony abnormality.  DG Knee Complete 4 Views Right Result Date: 04/18/2024 CLINICAL DATA:  Knee swelling.  Pain and swelling for 1 year. EXAM: RIGHT KNEE - COMPLETE 4+ VIEW COMPARISON:  Radiograph 11/05/2021 FINDINGS: No fracture. The alignment is normal. Joint spaces are preserved. Mild to moderate tricompartmental peripheral spurring. No erosions or focal bone abnormality. There is a small knee joint effusion. Mild generalized soft tissue edema. IMPRESSION: Mild to moderate tricompartmental osteoarthritis. Small knee joint effusion. Electronically Signed   By: Andrea Gasman M.D.   On: 04/18/2024 18:46    PROCEDURES:  Critical Care performed: No  Procedures   MEDICATIONS ORDERED IN ED: Medications  ketorolac  (TORADOL ) 30 MG/ML  injection 30 mg (30 mg Intramuscular Given 04/18/24 1736)  dexamethasone  (DECADRON ) injection 10 mg (10 mg Intramuscular Given 04/18/24 1738)  HYDROcodone -acetaminophen  (NORCO/VICODIN) 5-325 MG per tablet 1 tablet (1 tablet Oral Given 04/18/24 1846)     IMPRESSION / MDM / ASSESSMENT AND PLAN / ED COURSE  I reviewed the triage vital signs and the nursing notes.                               38 y.o. female presents to the emergency department for evaluation and treatment of acute on chronic right knee pain. See HPI for further details.   Differential diagnosis includes, but is not limited to arthritis, patellofemoral pain, bursitis, septic joint considered but less likely given absence of physical exam findings.  Patient's presentation is most consistent with acute complicated illness / injury requiring diagnostic workup.  Patient  is alert and oriented.  She is hemodynamically stable.  Physical exam findings are stated above.  X-ray is reassuring revealing mild to moderate tricompartmental osteoarthritis and small knee joint effusion.  Low suspicion for septic joint or septic bursitis at this time.  Ace wrap and crutches provided to weight-bear with crutch assistance as tolerated.  Encouraged patient to follow-up with her primary care provider for possible physical therapy referral.  Patient reports she has seen orthopedics of her chronic knee pain and is in eligible for surgical intervention at this time.  The patient is in stable condition for discharge home.  ED return precautions discussed.  Patient verbalized understanding.  FINAL CLINICAL IMPRESSION(S) / ED DIAGNOSES   Final diagnoses:  Chronic pain of both knees  Acute pain of right knee   Rx / DC Orders   ED Discharge Orders          Ordered    naproxen  (NAPROSYN ) 500 MG tablet  2 times daily with meals        04/18/24 1907    predniSONE  (DELTASONE ) 10 MG tablet  Daily        04/18/24 1914            Note:  This document was prepared using Dragon voice recognition software and may include unintentional dictation errors.    Margrette, Abraham Entwistle A, PA-C 04/18/24 1916    Viviann Pastor, MD 04/23/24 [phone]

## 2024-04-18 NOTE — ED Triage Notes (Signed)
 C/O right knee pian x months.

## 2024-04-18 NOTE — Discharge Instructions (Addendum)
 You were evaluated in the ED for right knee pain.  Your x-ray shows mild to moderate tricompartmental osteoarthritis with a small knee joint effusion.  This often, will go away on its own.  Get plenty of rest.  Limit physical activity.  Apply ice to the affected area 20 minutes on 20 minutes off.  You have been provided with an Ace wrap for compression and crutches to assist you with walking.  Please weight-bear using crutches as tolerated.   Follow-up with [facility] for further evaluation of your chronic knee pain.  Follow-up with your primary care provider to discuss physical therapy referral.

## 2024-07-02 ENCOUNTER — Ambulatory Visit

## 2024-07-02 DIAGNOSIS — B9689 Other specified bacterial agents as the cause of diseases classified elsewhere: Secondary | ICD-10-CM

## 2024-07-02 DIAGNOSIS — Z113 Encounter for screening for infections with a predominantly sexual mode of transmission: Secondary | ICD-10-CM

## 2024-07-02 DIAGNOSIS — N760 Acute vaginitis: Secondary | ICD-10-CM

## 2024-07-02 LAB — WET PREP FOR TRICH, YEAST, CLUE
Clue Cell Exam: POSITIVE — AB
Trichomonas Exam: NEGATIVE
Yeast Exam: NEGATIVE

## 2024-07-02 LAB — HM HIV SCREENING LAB: HM HIV Screening: NEGATIVE

## 2024-07-02 MED ORDER — METRONIDAZOLE 500 MG PO TABS: 500.0000 mg | ORAL_TABLET | Freq: Two times a day (BID) | ORAL | Status: AC

## 2024-07-02 NOTE — Progress Notes (Signed)
 [facility] Department STI clinic [address] [place] [address] Main phone: [phone]  STI screening visit  Subjective:  Anita [name] is a 38 y.o. female being seen today for an STI screening visit. The patient reports they do not have symptoms.  Patient has a history of a hysterectomy.  Patient's last menstrual period was 03/15/2016.  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 No symptoms or contacts.  See flowsheet for further details and programmatic requirements Hyperlink available at the top of the signed note in blue.  Flow sheet content below:  Pregnancy Intention Screening Does the patient want to become pregnant in the next year?: No Does the patient's partner want to become pregnant in the next year?: No Would the patient like to discuss contraceptive options today?: No Reason For STD Screen STD Screening: Is asymptomatic Have you ever had an STD?: Yes History of Antibiotic use in the past 2 weeks?: No STD Symptoms Denies all: Yes Risk Factors for Hep B Household, sexual, or needle sharing contact of a person infected with Hep B: No Sexual contact with a person who uses drugs not as prescribed?: No Currently or Ever used drugs not as prescribed: No HIV Positive: No PRep Patient: No Men who have sex with men: No Have Hepatitis C: No History of Incarceration: No History of Homeslessness?: No Anal sex following anal drug use?: No Risk Factors for Hep C Currently using drugs not as  prescribed: No Sexual partner(s) currently using drugs as not prescribed: No History of drug use: No HIV Positive: No People with a history of incarceration: No People born between the years of 18 and 100: No Abuse History Has patient ever been abused physically?: No Has patient ever been abused sexually?: No Does patient feel they have a problem with Anxiety?: No Does patient feel they have a problem with Depression?: Yes Referral to Behavioral Health: Yes Counseling Patient counseled to use condoms with all sex: Condoms declined RTC in 2-3 weeks for test results: Yes Clinic will call if test results abnormal before test result appt.: Yes Test results given to patient Patient counseled to use condoms with all sex: Condoms declined   Screening for MPX risk: Does the patient have an unexplained rash? No Is the patient MSM? No Does the patient endorse multiple sex partners or anonymous sex partners? No Did the patient have close or sexual contact with a person diagnosed with MPX? No Has the patient traveled outside the [place]  where MPX is endemic? No Is there a high clinical suspicion for MPX-- evidenced by one of the following No  -Unlikely to be chickenpox  -Lymphadenopathy  -Rash that present in same phase of evolution on any given body part  Screenings: Last HIV test per patient/review of record was  Lab Results  Component Value Date   HMHIVSCREEN Negative - Validated 02/06/2024    Lab Results  Component Value Date   HIV NON REACTIVE 06/03/2018     Last HEPC test per patient/review of record was  Lab Results  Component Value Date  HMHEPCSCREEN Negative-Validated 02/06/2024   No components found for: HEPC   Last HEPB test per patient/review of record was No components found for: HMHEPBSCREEN   Patient reports last pap was:   No results found for: SPECADGYN Result Date Procedure Results Follow-ups  04/04/2016 Surgical pathology SURGICAL PATHOLOGY: Surgical  Pathology CASE: [id number] PATIENT: Lidiya Trickett Surgical Pathology Report     SPECIMEN SUBMITTED: A. Uterus with cervix, left ovary  CLINICAL HISTORY: None provided  PRE-OPERATIVE DIAGNOSIS: Menorrhagia, anemia, uterine lei...   10/19/2011 HM PAP SMEAR HM Pap smear: normal     Immunization history:   There is no immunization history on file for this patient.  The following portions of the patient's history were reviewed and updated as appropriate: allergies, current medications, past medical history, past social history, past surgical history and problem list.  Objective:  There were no vitals filed for this visit.  Physical Exam Vitals and nursing note reviewed. Exam conducted with a chaperone present Brett Orange).  Constitutional:      Appearance: Normal appearance.  HENT:     Head: Normocephalic and atraumatic.     Mouth/Throat:     Mouth: Mucous membranes are moist.     Pharynx: Oropharynx is clear. No oropharyngeal exudate or posterior oropharyngeal erythema.  Pulmonary:     Effort: Pulmonary effort is normal.  Abdominal:     General: Abdomen is flat.     Palpations: There is no mass.     Tenderness: There is no abdominal tenderness. There is no rebound.  Genitourinary:    General: Normal vulva.     Exam position: Lithotomy position.     Pubic Area: No rash or pubic lice.      Labia:        Right: No rash or lesion.        Left: No rash or lesion.      Vagina: Vaginal discharge present. No erythema, bleeding or lesions.     Cervix: No cervical motion tenderness, discharge, friability, lesion or erythema.     Uterus: Normal.      Adnexa: Right adnexa normal and left adnexa normal.     Rectum: Normal.     Comments: pH = <4.5 Thick white discharge Genital piercing Lymphadenopathy:     Head:     Right side of head: No preauricular or posterior auricular adenopathy.     Left side of head: No preauricular or posterior auricular adenopathy.      Cervical: No cervical adenopathy.     Upper Body:     Right upper body: No supraclavicular, axillary or epitrochlear adenopathy.     Left upper body: No supraclavicular, axillary or epitrochlear adenopathy.     Lower Body: No right inguinal adenopathy. No left inguinal adenopathy.  Skin:    General: Skin is warm and dry.     Findings: No rash.  Neurological:     Mental Status: She is alert and oriented to person, place, and time.    Assessment and Plan:  Kista AMYJO MIZRACHI is a 38 y.o. female presenting to the [facility] Department for STI screening  1. Screening for venereal disease (Primary)  - WET PREP FOR TRICH, YEAST, CLUE - Chlamydia/Gonorrhea [place] [facility] - HIV [place] [facility] - Syphilis Serology, [place] [facility]  2. BV (bacterial vaginosis) (Primary) - Discharge, positive clue and amine, odor - metroNIDAZOLE  (FLAGYL ) 500 MG tablet; Take 1 tablet (500 mg total) by mouth 2 (two) times daily for 7 days.  Patient accepted the following screenings: vaginal CT/GC swab, vaginal wet prep, HIV, and RPR Patient meets criteria for HepB screening? No. Ordered? no Patient meets criteria for HepC screening? No. Ordered? no  Treat wet prep per standing order Discussed time line for [facility] results and that patient will be called with positive results and encouraged patient to call if she had not heard in 2 weeks.  Counseled to return or seek care for continued or worsening symptoms Recommended repeat testing in 3 months with positive results. Recommended condom use with all sex for STI prevention.   Return if symptoms worsen or fail to improve.  No future appointments.  Damien FORBES Satchel, NP

## 2024-07-02 NOTE — Progress Notes (Signed)
 Pt here for STI screening.  Wet mount results reviewed with patient.  Results positive for bacterial vaginosis.  Metronidazole  500mg  #14 dispensed to patient.  Counseled re medication, side effects, plan of care and when to contact clinic with questions or concerns.  Verbalizes understanding. Condoms declined.-Tacy Chavis, RN
